# Patient Record
Sex: Female | Born: 1970 | Race: White | Hispanic: No | Marital: Single | Smoking: Never smoker
Health system: Southern US, Community
[De-identification: ages and names within clinical notes are randomized; demographics above are authoritative.]

## PROBLEM LIST (undated history)

## (undated) DIAGNOSIS — F418 Other specified anxiety disorders: Secondary | ICD-10-CM

## (undated) DIAGNOSIS — Z460 Encounter for fitting and adjustment of spectacles and contact lenses: Secondary | ICD-10-CM

## (undated) DIAGNOSIS — Z789 Other specified health status: Secondary | ICD-10-CM

## (undated) DIAGNOSIS — R6889 Other general symptoms and signs: Secondary | ICD-10-CM

## (undated) DIAGNOSIS — F329 Major depressive disorder, single episode, unspecified: Secondary | ICD-10-CM

## (undated) DIAGNOSIS — F32A Depression, unspecified: Secondary | ICD-10-CM

## (undated) DIAGNOSIS — K8020 Calculus of gallbladder without cholecystitis without obstruction: Secondary | ICD-10-CM

## (undated) DIAGNOSIS — R41840 Attention and concentration deficit: Secondary | ICD-10-CM

## (undated) HISTORY — DX: Depression, unspecified: F32.A

## (undated) HISTORY — DX: Calculus of gallbladder without cholecystitis without obstruction: K80.20

---
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
## 1998-09-24 HISTORY — PX: ENDOMETRIAL ABLATION: SHX621

## 2000-11-29 ENCOUNTER — Other Ambulatory Visit: Admission: RE | Admit: 2000-11-29 | Discharge: 2000-11-29 | Payer: Self-pay | Admitting: *Deleted

## 2002-02-10 ENCOUNTER — Other Ambulatory Visit: Admission: RE | Admit: 2002-02-10 | Discharge: 2002-02-10 | Payer: Self-pay | Admitting: Obstetrics and Gynecology

## 2002-02-23 ENCOUNTER — Other Ambulatory Visit: Admission: RE | Admit: 2002-02-23 | Discharge: 2002-02-23 | Payer: Self-pay | Admitting: Obstetrics and Gynecology

## 2002-10-16 ENCOUNTER — Other Ambulatory Visit: Admission: RE | Admit: 2002-10-16 | Discharge: 2002-10-16 | Payer: Self-pay | Admitting: Obstetrics and Gynecology

## 2003-07-01 ENCOUNTER — Other Ambulatory Visit: Admission: RE | Admit: 2003-07-01 | Discharge: 2003-07-01 | Payer: Self-pay | Admitting: Obstetrics and Gynecology

## 2012-03-11 ENCOUNTER — Other Ambulatory Visit: Payer: Self-pay | Admitting: Gastroenterology

## 2012-03-11 DIAGNOSIS — R109 Unspecified abdominal pain: Secondary | ICD-10-CM

## 2012-03-25 ENCOUNTER — Ambulatory Visit (HOSPITAL_COMMUNITY): Admission: RE | Admit: 2012-03-25 | Payer: Self-pay | Source: Ambulatory Visit

## 2012-03-25 ENCOUNTER — Ambulatory Visit (HOSPITAL_COMMUNITY)
Admission: RE | Admit: 2012-03-25 | Discharge: 2012-03-25 | Disposition: A | Discharge status: Home / Self Care | Payer: BC Managed Care – PPO | Source: Ambulatory Visit | Attending: Gastroenterology | Admitting: Gastroenterology

## 2012-03-25 DIAGNOSIS — K8020 Calculus of gallbladder without cholecystitis without obstruction: Secondary | ICD-10-CM | POA: Insufficient documentation

## 2012-03-25 DIAGNOSIS — R109 Unspecified abdominal pain: Secondary | ICD-10-CM

## 2012-04-07 ENCOUNTER — Telehealth (INDEPENDENT_AMBULATORY_CARE_PROVIDER_SITE_OTHER): Payer: Self-pay

## 2012-04-07 NOTE — Telephone Encounter (Signed)
The pt was referred by Dr Loreta Ave for gallstones.  She is having increasing pain and vomited this am.  She feels feverish at times.  She wants to be put on a cancellation list if she can be put in sooner for Dr Lindie Spruce.  Please call.

## 2012-04-29 ENCOUNTER — Ambulatory Visit (INDEPENDENT_AMBULATORY_CARE_PROVIDER_SITE_OTHER): Payer: BC Managed Care – PPO | Admitting: General Surgery

## 2012-04-29 ENCOUNTER — Encounter (INDEPENDENT_AMBULATORY_CARE_PROVIDER_SITE_OTHER): Payer: Self-pay | Admitting: General Surgery

## 2012-04-29 VITALS — BP 114/62 | HR 68 | Temp 97.4°F | Resp 14 | Ht 72.0 in | Wt 212.4 lb

## 2012-04-29 DIAGNOSIS — K8020 Calculus of gallbladder without cholecystitis without obstruction: Secondary | ICD-10-CM

## 2012-04-29 NOTE — Progress Notes (Signed)
Patient ID: Samantha Parker, female   DOB: [birth year], 41 y.o.   MRN: [id number]  Chief Complaint  Patient presents with  . Cholelithiasis    HPI Samantha Parker is a 41 y.o. female.  Symptomatic cholelithiasis.   HPI   Patient has been having intermittent pain and discomfort for the past 4-5 months in the RUQ.  Not related to meals.  Comes and goes, but is constantly at a 4-5/10 level.  No fevers or chills.  No jaundice. Past Medical History  Diagnosis Date  . Gallstones     Past Surgical History  Procedure Date  . Endometrial ablation 2000    Family History  Problem Relation Age of Onset  . Cancer Maternal Grandmother     breast    Social History History  Substance Use Topics  . Smoking status: Passive Smoker    Types: Cigarettes  . Smokeless tobacco: Never Used  . Alcohol Use: No    Allergies  Allergen Reactions  . Penicillins Nausea Only and Rash    Rash will spread all over the body.    No current outpatient prescriptions on file.    Review of Systems Review of Systems  Constitutional: Negative.   HENT: Negative.   Gastrointestinal: Positive for nausea and abdominal pain (RUQ). Negative for diarrhea, anal bleeding and rectal pain.  Genitourinary: Negative.   Musculoskeletal: Negative.   Skin: Negative.   Neurological: Negative.   Hematological: Negative.   Psychiatric/Behavioral: Negative.     Blood pressure 114/62, pulse 68, temperature 97.4 F (36.3 C), temperature source Temporal, resp. rate 14, height 6' (1.829 m), weight 212 lb 6 oz (96.333 kg).  Physical Exam Physical Exam  Constitutional: She is oriented to person, place, and time. She appears well-developed and well-nourished.  HENT:  Head: Normocephalic and atraumatic.  Eyes: Conjunctivae are normal. Pupils are equal, round, and reactive to light.  Neck: Normal range of motion. Neck supple.  Cardiovascular: Normal rate, regular rhythm and normal heart sounds.   Pulmonary/Chest:  Effort normal and breath sounds normal.  Abdominal: Soft. Bowel sounds are normal. There is tenderness in the right upper quadrant. There is no rigidity and negative Murphy's sign.    Musculoskeletal: Normal range of motion.  Neurological: She is alert and oriented to person, place, and time. She has normal reflexes.  Skin: Skin is warm and dry.  Psychiatric: She has a normal mood and affect. Her behavior is normal. Judgment and thought content normal.    Data Reviewed Ultrasound and labs  Assessment    Symptomatic cholelithiasis with chronic cholecystitis    Plan    Laparoscopic cholecystectomy with IOC Risks and benefits have been explained to the patient and she wishes to proceed. Given brochure about surgery.       Cherylynn Ridges 04/29/2012, 12:30 PM

## 2012-05-02 ENCOUNTER — Encounter (HOSPITAL_BASED_OUTPATIENT_CLINIC_OR_DEPARTMENT_OTHER): Payer: Self-pay | Admitting: *Deleted

## 2012-05-02 NOTE — Progress Notes (Signed)
To come in for labs No cardiac or resp problems

## 2012-05-07 ENCOUNTER — Encounter (HOSPITAL_BASED_OUTPATIENT_CLINIC_OR_DEPARTMENT_OTHER)
Admission: RE | Admit: 2012-05-07 | Discharge: 2012-05-07 | Disposition: A | Discharge status: Home / Self Care | Payer: BC Managed Care – PPO | Source: Ambulatory Visit | Attending: General Surgery | Admitting: General Surgery

## 2012-05-07 LAB — COMPREHENSIVE METABOLIC PANEL
CO2: 23 mEq/L (ref 19–32)
Calcium: 9.1 mg/dL (ref 8.4–10.5)
Creatinine, Ser: 0.81 mg/dL (ref 0.50–1.10)
GFR calc Af Amer: >90 mL/min (ref >90)
GFR calc non Af Amer: 88 mL/min — ABNORMAL LOW (ref >90)
Glucose, Bld: 115 mg/dL — ABNORMAL HIGH (ref 70–99)
Total Bilirubin: 0.2 mg/dL — ABNORMAL LOW (ref 0.3–1.2)

## 2012-05-07 LAB — CBC WITH DIFFERENTIAL/PLATELET
Eosinophils Relative: 2 % (ref 0–5)
HCT: 41.9 % (ref 36.0–46.0)
Hemoglobin: 13.6 g/dL (ref 12.0–15.0)
Lymphocytes Relative: 17 % (ref 12–46)
Lymphs Abs: 1.9 10*3/uL (ref 0.7–4.0)
MCV: 90.5 fL (ref 78.0–100.0)
Monocytes Absolute: 0.6 10*3/uL (ref 0.1–1.0)
Monocytes Relative: 5 % (ref 3–12)
RBC: 4.63 MIL/uL (ref 3.87–5.11)
WBC: 10.9 10*3/uL — ABNORMAL HIGH (ref 4.0–10.5)

## 2012-05-08 ENCOUNTER — Ambulatory Visit (HOSPITAL_BASED_OUTPATIENT_CLINIC_OR_DEPARTMENT_OTHER): Payer: BC Managed Care – PPO | Admitting: Anesthesiology

## 2012-05-08 ENCOUNTER — Encounter (HOSPITAL_BASED_OUTPATIENT_CLINIC_OR_DEPARTMENT_OTHER): Payer: Self-pay | Admitting: Anesthesiology

## 2012-05-08 ENCOUNTER — Ambulatory Visit (HOSPITAL_BASED_OUTPATIENT_CLINIC_OR_DEPARTMENT_OTHER)
Admission: RE | Admit: 2012-05-08 | Discharge: 2012-05-08 | Disposition: A | Discharge status: Home / Self Care | Payer: BC Managed Care – PPO | Source: Ambulatory Visit | Attending: General Surgery | Admitting: General Surgery

## 2012-05-08 ENCOUNTER — Encounter (HOSPITAL_BASED_OUTPATIENT_CLINIC_OR_DEPARTMENT_OTHER)
Admission: RE | Disposition: A | Discharge status: Home / Self Care | Payer: Self-pay | Source: Ambulatory Visit | Attending: General Surgery

## 2012-05-08 ENCOUNTER — Encounter (HOSPITAL_BASED_OUTPATIENT_CLINIC_OR_DEPARTMENT_OTHER): Payer: Self-pay

## 2012-05-08 ENCOUNTER — Ambulatory Visit (HOSPITAL_COMMUNITY): Payer: BC Managed Care – PPO

## 2012-05-08 DIAGNOSIS — K8010 Calculus of gallbladder with chronic cholecystitis without obstruction: Secondary | ICD-10-CM

## 2012-05-08 DIAGNOSIS — K8020 Calculus of gallbladder without cholecystitis without obstruction: Secondary | ICD-10-CM

## 2012-05-08 HISTORY — DX: Other specified health status: Z78.9

## 2012-05-08 HISTORY — PX: CHOLECYSTECTOMY: SHX55

## 2012-05-08 HISTORY — DX: Encounter for fitting and adjustment of spectacles and contact lenses: Z46.0

## 2012-05-08 SURGERY — LAPAROSCOPIC CHOLECYSTECTOMY WITH INTRAOPERATIVE CHOLANGIOGRAM
Anesthesia: General | Site: Abdomen | Wound class: Clean

## 2012-05-08 MED ORDER — LACTATED RINGERS IV SOLN
INTRAVENOUS | Status: DC
  Administered 2012-05-08 (×2): via INTRAVENOUS

## 2012-05-08 MED ORDER — FENTANYL CITRATE 0.05 MG/ML IJ SOLN
INTRAMUSCULAR | Status: DC | PRN
  Administered 2012-05-08: 25 ug via INTRAVENOUS
  Administered 2012-05-08: 50 ug via INTRAVENOUS
  Administered 2012-05-08: 100 ug via INTRAVENOUS
  Administered 2012-05-08: 25 ug via INTRAVENOUS

## 2012-05-08 MED ORDER — MIDAZOLAM HCL 5 MG/5ML IJ SOLN
INTRAMUSCULAR | Status: DC | PRN
  Administered 2012-05-08: 2 mg via INTRAVENOUS

## 2012-05-08 MED ORDER — ONDANSETRON HCL 4 MG/2ML IJ SOLN: 4.0000 mg | Freq: Once | INTRAMUSCULAR | Status: DC | PRN

## 2012-05-08 MED ORDER — METOCLOPRAMIDE HCL 5 MG/ML IJ SOLN
INTRAMUSCULAR | Status: DC | PRN
  Administered 2012-05-08: 10 mg via INTRAVENOUS

## 2012-05-08 MED ORDER — NEOSTIGMINE METHYLSULFATE 1 MG/ML IJ SOLN
INTRAMUSCULAR | Status: DC | PRN
  Administered 2012-05-08: 3 mg via INTRAVENOUS

## 2012-05-08 MED ORDER — ONDANSETRON HCL 4 MG/2ML IJ SOLN
INTRAMUSCULAR | Status: DC | PRN
  Administered 2012-05-08: 4 mg via INTRAVENOUS

## 2012-05-08 MED ORDER — SODIUM CHLORIDE 0.9 % IJ SOLN
INTRAMUSCULAR | Status: DC | PRN
  Administered 2012-05-08 (×2)

## 2012-05-08 MED ORDER — PROPOFOL 10 MG/ML IV EMUL
INTRAVENOUS | Status: DC | PRN
  Administered 2012-05-08: 200 mg via INTRAVENOUS
  Administered 2012-05-08 (×2): 40 mg via INTRAVENOUS

## 2012-05-08 MED ORDER — CIPROFLOXACIN IN D5W 400 MG/200ML IV SOLN
400.0000 mg | INTRAVENOUS | Status: AC
  Administered 2012-05-08: 400 mg via INTRAVENOUS

## 2012-05-08 MED ORDER — BUPIVACAINE-EPINEPHRINE PF 0.25-1:200000 % IJ SOLN
INTRAMUSCULAR | Status: DC | PRN
  Administered 2012-05-08: 17 mL

## 2012-05-08 MED ORDER — HYDROMORPHONE HCL PF 1 MG/ML IJ SOLN
0.2500 mg | INTRAMUSCULAR | Status: DC | PRN
  Administered 2012-05-08 (×3): 0.5 mg via INTRAVENOUS

## 2012-05-08 MED ORDER — DEXAMETHASONE SODIUM PHOSPHATE 4 MG/ML IJ SOLN
INTRAMUSCULAR | Status: DC | PRN
  Administered 2012-05-08: 10 mg via INTRAVENOUS

## 2012-05-08 MED ORDER — LIDOCAINE HCL (CARDIAC) 10 MG/ML IV SOLN
INTRAVENOUS | Status: DC | PRN
  Administered 2012-05-08: 100 mg via INTRAVENOUS

## 2012-05-08 MED ORDER — SODIUM CHLORIDE 0.9 % IV SOLN
INTRAVENOUS | Status: DC | PRN
  Administered 2012-05-08: 450 mL via INTRAMUSCULAR

## 2012-05-08 MED ORDER — HYDROCODONE-ACETAMINOPHEN 5-500 MG PO TABS: 1.0000 | ORAL_TABLET | Freq: Four times a day (QID) | ORAL | Status: AC | PRN

## 2012-05-08 MED ORDER — ROCURONIUM BROMIDE 100 MG/10ML IV SOLN
INTRAVENOUS | Status: DC | PRN
  Administered 2012-05-08: 50 mg via INTRAVENOUS

## 2012-05-08 MED ORDER — GLYCOPYRROLATE 0.2 MG/ML IJ SOLN
INTRAMUSCULAR | Status: DC | PRN
  Administered 2012-05-08: 0.4 mg via INTRAVENOUS

## 2012-05-08 SURGICAL SUPPLY — 52 items
ADH SKN CLS APL DERMABOND .7 (GAUZE/BANDAGES/DRESSINGS) ×1
APPLIER CLIP 5 13 M/L LIGAMAX5 (MISCELLANEOUS) ×4
APPLIER CLIP ROT 10 11.4 M/L (STAPLE) ×2
APR CLP MED LRG 11.4X10 (STAPLE) ×1
APR CLP MED LRG 5 ANG JAW (MISCELLANEOUS) ×2
BAG SPEC RTRVL LRG 6X4 10 (ENDOMECHANICALS) ×1
BLADE SURG ROTATE 9660 (MISCELLANEOUS) IMPLANT
CANISTER SUCTION 2500CC (MISCELLANEOUS) ×2 IMPLANT
CHLORAPREP W/TINT 26ML (MISCELLANEOUS) ×2 IMPLANT
CLEANER CAUTERY TIP 5X5 PAD (MISCELLANEOUS) ×1 IMPLANT
CLIP APPLIE 5 13 M/L LIGAMAX5 (MISCELLANEOUS) ×1 IMPLANT
CLIP APPLIE ROT 10 11.4 M/L (STAPLE) ×1 IMPLANT
CLOTH BEACON ORANGE TIMEOUT ST (SAFETY) ×2 IMPLANT
COVER MAYO STAND STRL (DRAPES) ×2 IMPLANT
DECANTER SPIKE VIAL GLASS SM (MISCELLANEOUS) IMPLANT
DERMABOND ADVANCED (GAUZE/BANDAGES/DRESSINGS) ×1
DERMABOND ADVANCED .7 DNX12 (GAUZE/BANDAGES/DRESSINGS) ×1 IMPLANT
DRAPE C-ARM 42X72 X-RAY (DRAPES) ×2 IMPLANT
DRAPE OEC MINIVIEW 54X84 (DRAPES) IMPLANT
DRAPE UTILITY XL STRL (DRAPES) ×2 IMPLANT
DRSG TEGADERM 2-3/8X2-3/4 SM (GAUZE/BANDAGES/DRESSINGS) ×4 IMPLANT
ELECT REM PT RETURN 9FT ADLT (ELECTROSURGICAL) ×2
ELECTRODE REM PT RTRN 9FT ADLT (ELECTROSURGICAL) ×1 IMPLANT
GLOVE BIOGEL M STRL SZ7.5 (GLOVE) ×1 IMPLANT
GLOVE BIOGEL PI IND STRL 8 (GLOVE) ×1 IMPLANT
GLOVE BIOGEL PI INDICATOR 8 (GLOVE) ×3
GLOVE ECLIPSE 7.5 STRL STRAW (GLOVE) ×2 IMPLANT
GOWN STRL NON-REIN LRG LVL3 (GOWN DISPOSABLE) ×6 IMPLANT
HEMOSTAT SNOW SURGICEL 2X4 (HEMOSTASIS) IMPLANT
NDL HYPO 25X1 1.5 SAFETY (NEEDLE) IMPLANT
NDL SAFETY ECLIPSE 18X1.5 (NEEDLE) IMPLANT
NEEDLE HYPO 18GX1.5 SHARP (NEEDLE) ×2
NEEDLE HYPO 25X1 1.5 SAFETY (NEEDLE) ×2 IMPLANT
NS IRRIG 1000ML POUR BTL (IV SOLUTION) ×1 IMPLANT
PACK BASIN DAY SURGERY FS (CUSTOM PROCEDURE TRAY) ×2 IMPLANT
PAD CLEANER CAUTERY TIP 5X5 (MISCELLANEOUS) ×1
POUCH SPECIMEN RETRIEVAL 10MM (ENDOMECHANICALS) ×2 IMPLANT
SCISSORS LAP 5X35 DISP (ENDOMECHANICALS) IMPLANT
SET CHOLANGIOGRAPH 5 50 .035 (SET/KITS/TRAYS/PACK) ×1 IMPLANT
SET IRRIG TUBING LAPAROSCOPIC (IRRIGATION / IRRIGATOR) ×2 IMPLANT
SLEEVE ENDOPATH XCEL 5M (ENDOMECHANICALS) ×5 IMPLANT
SLEEVE SCD COMPRESS KNEE MED (MISCELLANEOUS) ×2 IMPLANT
SPECIMEN JAR SMALL (MISCELLANEOUS) ×2 IMPLANT
SUT MNCRL AB 4-0 PS2 18 (SUTURE) ×2 IMPLANT
SYR 20CC LL (SYRINGE) ×1 IMPLANT
TOWEL OR 17X24 6PK STRL BLUE (TOWEL DISPOSABLE) ×2 IMPLANT
TOWEL OR NON WOVEN STRL DISP B (DISPOSABLE) ×2 IMPLANT
TRAY LAPAROSCOPIC (CUSTOM PROCEDURE TRAY) ×2 IMPLANT
TROCAR XCEL BLUNT TIP 100MML (ENDOMECHANICALS) ×2 IMPLANT
TROCAR XCEL NON-BLD 11X100MML (ENDOMECHANICALS) IMPLANT
TROCAR XCEL NON-BLD 5MMX100MML (ENDOMECHANICALS) ×2 IMPLANT
TUBE CONNECTING 20X1/4 (TUBING) ×2 IMPLANT

## 2012-05-08 NOTE — Anesthesia Preprocedure Evaluation (Signed)
Anesthesia Evaluation  Patient identified by MRN, date of birth, ID band Patient awake    Reviewed: Allergy & Precautions, H&P , NPO status , Patient's Chart, lab work & pertinent test results  Airway Mallampati: I TM Distance: >3 FB Neck ROM: Full    Dental  (+) Dental Advisory Given   Pulmonary  breath sounds clear to auscultation        Cardiovascular Rhythm:Regular Rate:Normal     Neuro/Psych    GI/Hepatic   Endo/Other    Renal/GU      Musculoskeletal   Abdominal   Peds  Hematology   Anesthesia Other Findings   Reproductive/Obstetrics                          Anesthesia Physical Anesthesia Plan  ASA: I  Anesthesia Plan: General   Post-op Pain Management:    Induction: Intravenous  Airway Management Planned: Oral ETT  Additional Equipment:   Intra-op Plan:   Post-operative Plan: Extubation in OR  Informed Consent: I have reviewed the patients History and Physical, chart, labs and discussed the procedure including the risks, benefits and alternatives for the proposed anesthesia with the patient or authorized representative who has indicated his/her understanding and acceptance.   Dental advisory given  Plan Discussed with: CRNA, Anesthesiologist and Surgeon  Anesthesia Plan Comments:         Anesthesia Quick Evaluation  

## 2012-05-08 NOTE — Anesthesia Postprocedure Evaluation (Signed)
Anesthesia Post Note  [name]  Procedure(s) Performed: Procedure(s) (LRB): LAPAROSCOPIC CHOLECYSTECTOMY WITH INTRAOPERATIVE CHOLANGIOGRAM (N/A)  Anesthesia type: General  Patient location: PACU  Post pain: Pain level controlled  Post assessment: Patient's Cardiovascular Status Stable  Last Vitals:  Filed Vitals:   05/08/12 1530  BP: 117/66  Pulse: 92  Temp:   Resp: 15    Post vital signs: Reviewed and stable  Level of consciousness: alert  Complications: No apparent anesthesia complications

## 2012-05-08 NOTE — Op Note (Signed)
OPERATIVE REPORT  DATE OF OPERATION: 05/08/2012  PATIENT:  Samantha Parker  41 y.o. female  PRE-OPERATIVE DIAGNOSIS:  symptomatic gallstones  POST-OPERATIVE DIAGNOSIS:  symptomatic gallstones/hydrops of gallbladder  PROCEDURE:  Procedure(s): LAPAROSCOPIC CHOLECYSTECTOMY WITH INTRAOPERATIVE CHOLANGIOGRAM  SURGEON:  Surgeon(s): Cherylynn Ridges, MD  ASSISTANT: None  ANESTHESIA:   general  EBL: <20 ml  BLOOD ADMINISTERED: none  DRAINS: none   SPECIMEN:  Source of Specimen:  Gallbladder with stones  COUNTS CORRECT:  YES  PROCEDURE DETAILS: The patient was taken to the operating room and placed on the table in the supine position.  After an adequate endotracheal anesthetic was administered, (she/he) was prepped with ChloroPrep, and then draped in the usual manner exposing the entire abdomen laterally, inferiorly and up  to the costal margins.  After a proper timeout was performed including identifying the patient and the procedure to be performed, a supra-umbilical1.5cm midline incision was made using a #15 blade.  This was taken down to the fascia which was then incised with a #15 blade.  The edges of the fascia were tented up with Kocher clamps as the preperitoneal space was penetrated with a Kelly clamp into the peritoneum.  Once this was done, a pursestring suture of 0 Vicryl was passed around the fascial opening.  This was subsequently used to secure the [facility] cannula which was passed into the peritoneal cavity.  Once the [facility] cannula was in place, carbon dioxide gas was insufflated into the peritoneal cavity up to a maximal intra-abdominal pressure of 15mm Hg.The laparoscope, with attached camera and light source, was passed into the peritoneal cavity to visualize the direct insertion of two right upper quadrant 5mm cannulas, and a sup-xiphoid 5mm cannula.  Once all cannulas were in place, the dissection was begun.  The gallbladder was noted to be tense and distended.It was  aspirated with a long laparoscopic need attached to a syringe yeilding clear bile--hydrops of the gallbladder.  The cystic duct--after further dissection--appeared to be completely obstructed by multiple large stones.  Two ratcheted graspers were attached to the dome and infundibulum of the gallbladder after decompression and retracted towards the anterior abdominal wall and the right upper quadrant.  Using cautery attached to a dissecting forceps, the peritoneum overlaying the triangle of Chalot and the hepatoduodenal triangle was dissected away exposing the cystic duct and the cystic artery.  A clip was placed on the gallbladder side of the cystic duct, then a cholecytodochotomy made using the laparoscopic scissors.  Through the cholecystodochotomy a Cook catheter was passed to performed a cholangiogram.  The cholangiogram showed a dilated CBD without obvious intra-ductal stones, good flow into the duodenum.  The was fair proximal filling without obstruction.  Once the cholangiogram was completed, the [facility] catheter was removed, and the distal cystic duct was clipped multiple times then transected.  The gallbladder was then dissected out of the hepatic bed without event.  It was retrieved from the abdomen (using an EndoCatch bag) having to enlarge the fascial incision slightly..  Once the gallbladder was removed, the bed was inspected for hemostasis.  Once excellent hemostasis was obtained all gas and fluids were aspirated from above the liver, then the cannulas were removed.  The supra-umbilical incision was closed using the pursestring suture which was in place.  0.25% bupivicaine with epinephrine was injected at all sites.  All 10mm or greater cannula sites were close using a running subcuticular stitch of 4-0 Monocryl.  5.64mm cannula sites were closed with Dermabond only.Steri-Strips and  Tagaderm were used to complete the dressings at all sites.  At this point all needle, sponge, and instrument counts  were correct.The patient was awakened from anesthesia and taken to the PACU in stable condition.  PATIENT DISPOSITION:  PACU - hemodynamically stable.   Cherylynn Ridges 8/15/20132:31 PM

## 2012-05-08 NOTE — Anesthesia Procedure Notes (Signed)
Procedure Name: Intubation Date/Time: 05/08/2012 1:17 PM Performed by: Gar Gibbon Pre-anesthesia Checklist: Patient identified, Emergency Drugs available, Suction available and Patient being monitored Patient Re-evaluated:Patient Re-evaluated prior to inductionOxygen Delivery Method: Circle System Utilized Preoxygenation: Pre-oxygenation with 100% oxygen Intubation Type: IV induction Ventilation: Mask ventilation without difficulty Laryngoscope Size: Mac and 3 Grade View: Grade III Tube type: Oral Tube size: 7.0 mm Number of attempts: 1 Airway Equipment and Method: stylet and oral airway Placement Confirmation: ETT inserted through vocal cords under direct vision,  positive ETCO2 and breath sounds checked- equal and bilateral Secured at: 21 cm Tube secured with: Tape Dental Injury: Teeth and Oropharynx as per pre-operative assessment

## 2012-05-08 NOTE — H&P (Signed)
  Patient ID: Samantha Parker, female DOB: 1971/04/27, 41 y.o. MRN: [id number]  Chief Complaint   Patient presents with   .  Cholelithiasis   HPI  Samantha Parker is a 41 y.o. female. Symptomatic cholelithiasis.  HPI  Patient has been having intermittent pain and discomfort for the past 4-5 months in the RUQ. Not related to meals. Comes and goes, but is constantly at a 4-5/10 level. No fevers or chills. No jaundice.  Past Medical History   Diagnosis  Date   .  Gallstones     Past Surgical History   Procedure  Date   .  Endometrial ablation  2000    Family History   Problem  Relation  Age of Onset   .  Cancer  Maternal Grandmother       breast   Social History  History   Substance Use Topics   .  Smoking status:  Passive Smoker     Types:  Cigarettes   .  Smokeless tobacco:  Never Used   .  Alcohol Use:  No    Allergies   Allergen  Reactions   .  Penicillins  Nausea Only and Rash     Rash will spread all over the body.    No current outpatient prescriptions on file.   Review of Systems  Review of Systems  Constitutional: Negative.  HENT: Negative.  Gastrointestinal: Positive for nausea and abdominal pain (RUQ). Negative for diarrhea, anal bleeding and rectal pain.  Genitourinary: Negative.  Musculoskeletal: Negative.  Skin: Negative.  Neurological: Negative.  Hematological: Negative.  Psychiatric/Behavioral: Negative.  Blood pressure 114/62, pulse 68, temperature 97.4 F (36.3 C), temperature source Temporal, resp. rate 14, height 6' (1.829 m), weight 212 lb 6 oz (96.333 kg).  Physical Exam  Physical Exam  Constitutional: She is oriented to person, place, and time. She appears well-developed and well-nourished.  HENT:  Head: Normocephalic and atraumatic.  Eyes: Conjunctivae are normal. Pupils are equal, round, and reactive to light.  Neck: Normal range of motion. Neck supple.  Cardiovascular: Normal rate, regular rhythm and normal heart sounds.  Pulmonary/Chest:  Effort normal and breath sounds normal.  Abdominal: Soft. Bowel sounds are normal. There is tenderness in the right upper quadrant. There is no rigidity and negative Murphy's sign.    Musculoskeletal: Normal range of motion.  Neurological: She is alert and oriented to person, place, and time. She has normal reflexes.  Skin: Skin is warm and dry.  Psychiatric: She has a normal mood and affect. Her behavior is normal. Judgment and thought content normal.  Data Reviewed  Ultrasound and labs  Assessment  Symptomatic cholelithiasis with chronic cholecystitis  Plan  Laparoscopic cholecystectomy with IOC  Risks and benefits have been explained to the patient and she wishes to proceed.  Given brochure about surgery.

## 2012-05-08 NOTE — Transfer of Care (Signed)
Immediate Anesthesia Transfer of Care Note  [name]  Procedure(s) Performed: Procedure(s) (LRB): LAPAROSCOPIC CHOLECYSTECTOMY WITH INTRAOPERATIVE CHOLANGIOGRAM (N/A)  Patient Location: PACU  Anesthesia Type: General  Level of Consciousness: awake and patient cooperative  Airway & Oxygen Therapy: Patient Spontanous Breathing and Patient connected to face mask oxygen  Post-op Assessment: Report given to PACU RN and Post -op Vital signs reviewed and stable  Post vital signs: Reviewed and stable  Complications: No apparent anesthesia complications

## 2012-05-12 ENCOUNTER — Encounter (HOSPITAL_BASED_OUTPATIENT_CLINIC_OR_DEPARTMENT_OTHER): Payer: Self-pay | Admitting: General Surgery

## 2012-05-27 ENCOUNTER — Ambulatory Visit (INDEPENDENT_AMBULATORY_CARE_PROVIDER_SITE_OTHER): Payer: BC Managed Care – PPO | Admitting: General Surgery

## 2012-05-27 ENCOUNTER — Encounter (INDEPENDENT_AMBULATORY_CARE_PROVIDER_SITE_OTHER): Payer: Self-pay | Admitting: General Surgery

## 2012-05-27 VITALS — BP 126/80 | HR 70 | Temp 96.4°F | Resp 18 | Ht 71.0 in | Wt 211.0 lb

## 2012-05-27 DIAGNOSIS — Z09 Encounter for follow-up examination after completed treatment for conditions other than malignant neoplasm: Secondary | ICD-10-CM | POA: Insufficient documentation

## 2012-05-27 NOTE — Progress Notes (Signed)
The patient is doing well status post laparoscopic cholecystectomy. The periumbilical incision separated somewhat and is healing by secondary intention. All her other incisions are healing well with no evidence of infection.  Symptomatically the patient is doing well since the day after surgery. She continues to do well eating well with no evidence of any postoperative problems.  She is to return to see me on a when necessary basis.

## 2012-09-30 ENCOUNTER — Encounter (INDEPENDENT_AMBULATORY_CARE_PROVIDER_SITE_OTHER): Payer: Self-pay

## 2013-07-08 LAB — HM PAP SMEAR: CHL HPV: NEGATIVE

## 2013-08-24 ENCOUNTER — Other Ambulatory Visit: Payer: Self-pay | Admitting: Dermatology

## 2014-06-10 IMAGING — US US ABDOMEN COMPLETE
1 series · 13 of 25 positions shown · non-contrast
Comparison: None.

CLINICAL DATA: Abdominal pain.

COMPLETE ABDOMINAL ULTRASOUND

[Series 1: us abdomen complete · 0.31mm/px · 13 of 81 slices shown]
[im 1/81]
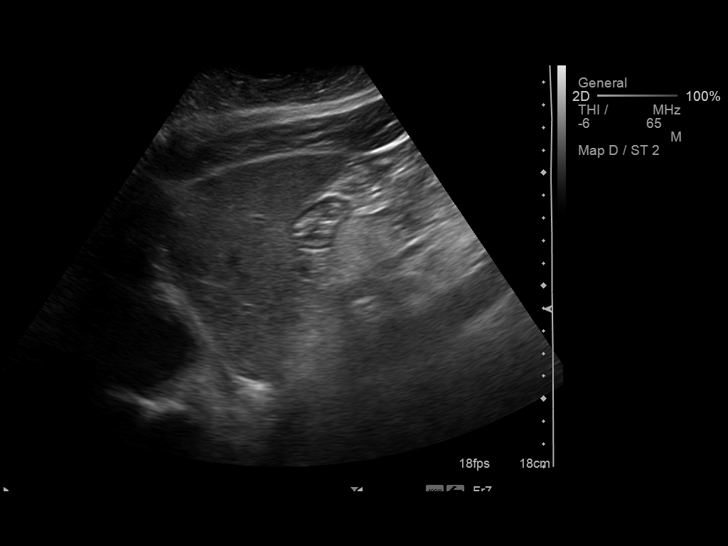
[im 7/81]
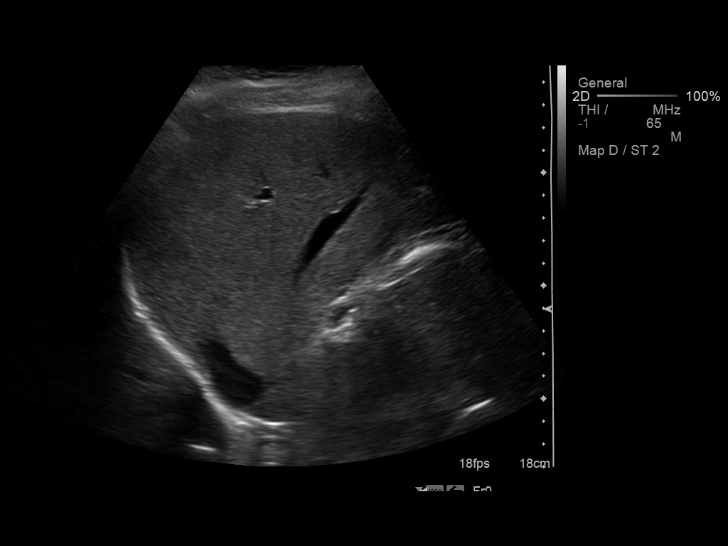
[im 14/81]
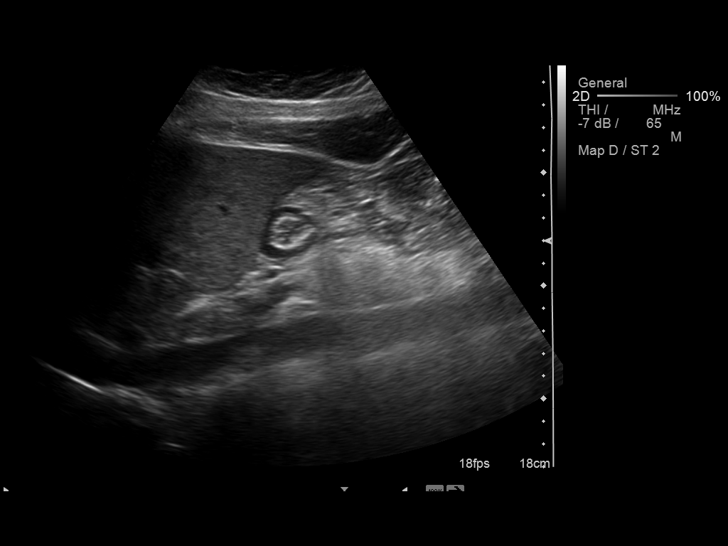
[im 21/81]
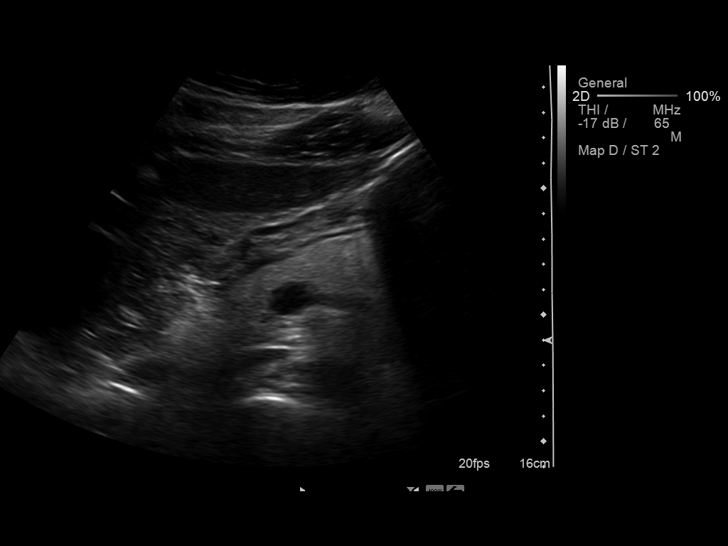
[im 27/81]
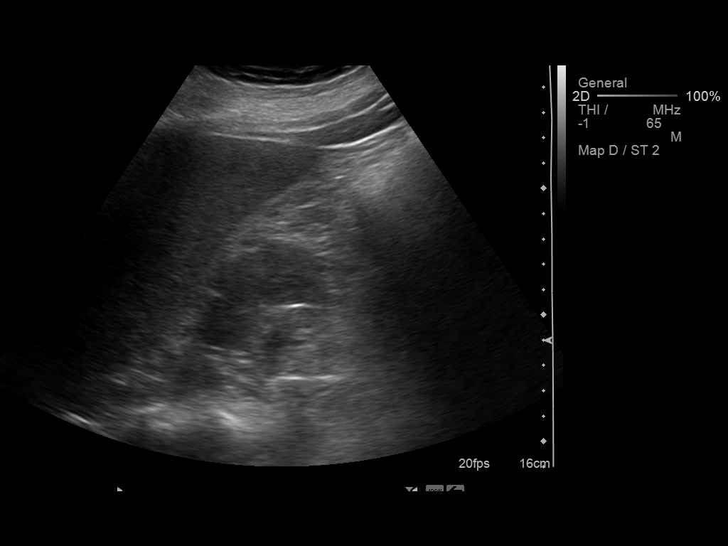
[im 34/81]
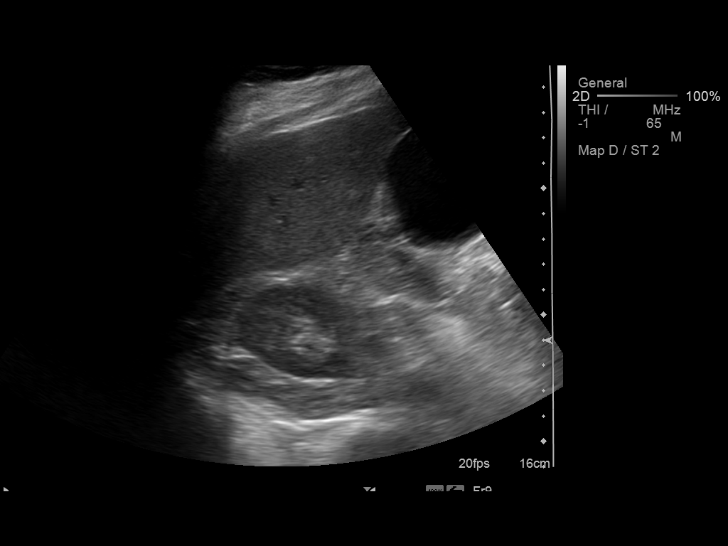
[im 41/81]
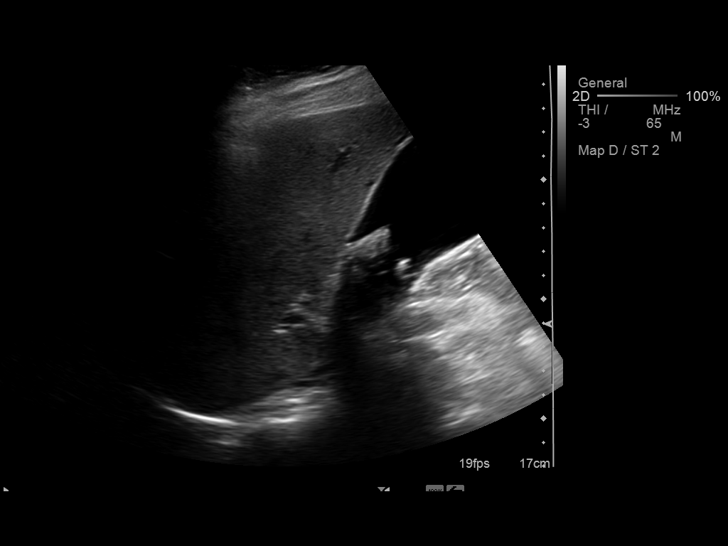
[im 47/81]
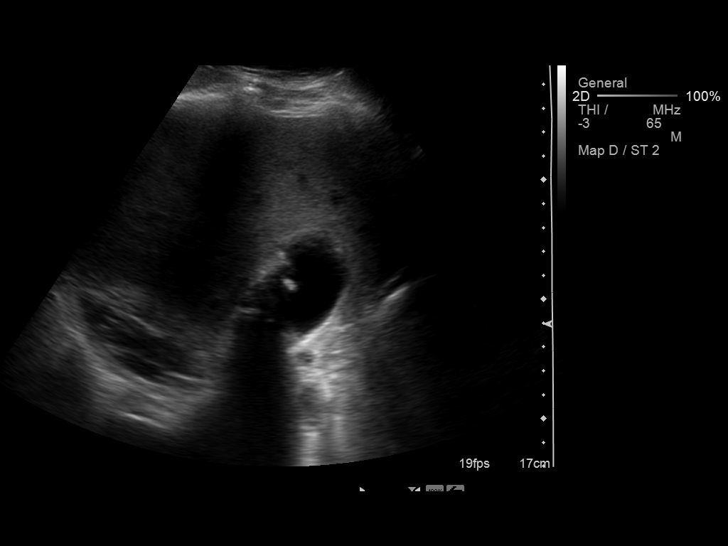
[im 54/81]
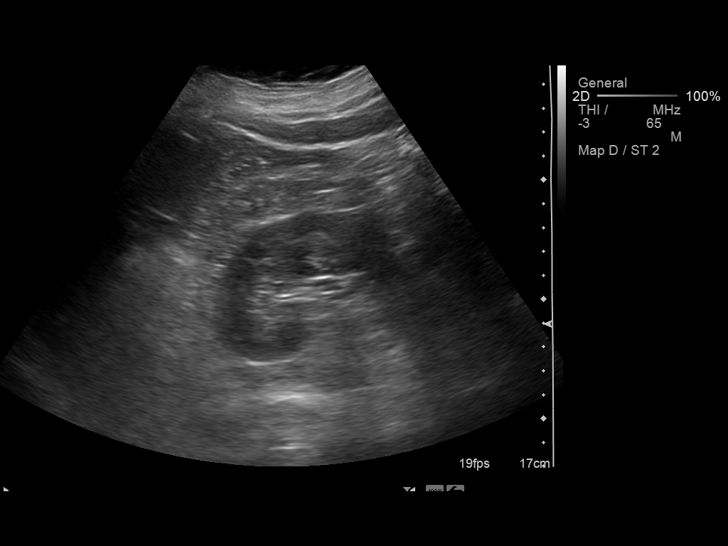
[im 61/81]
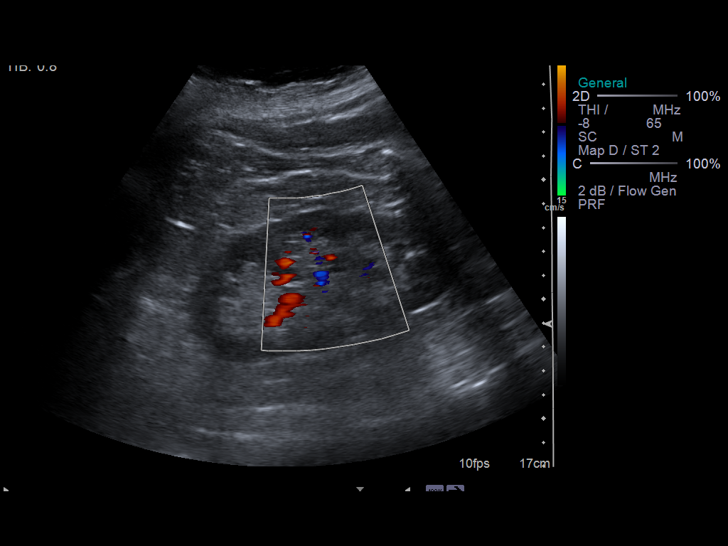
[im 67/81]
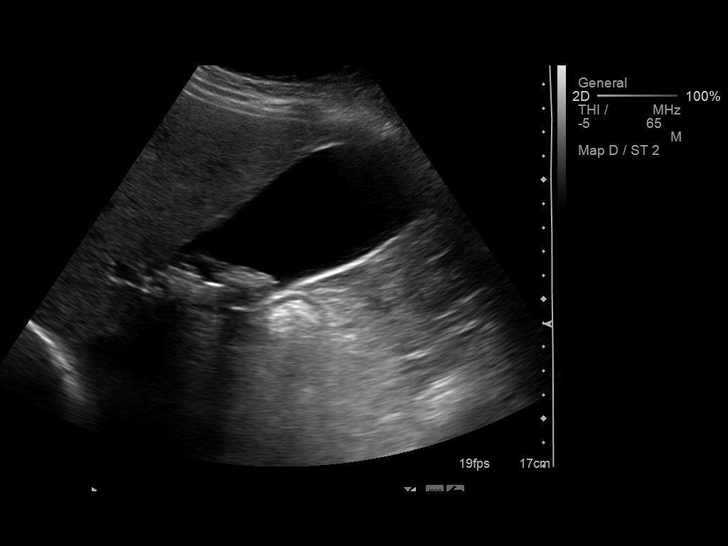
[im 74/81]
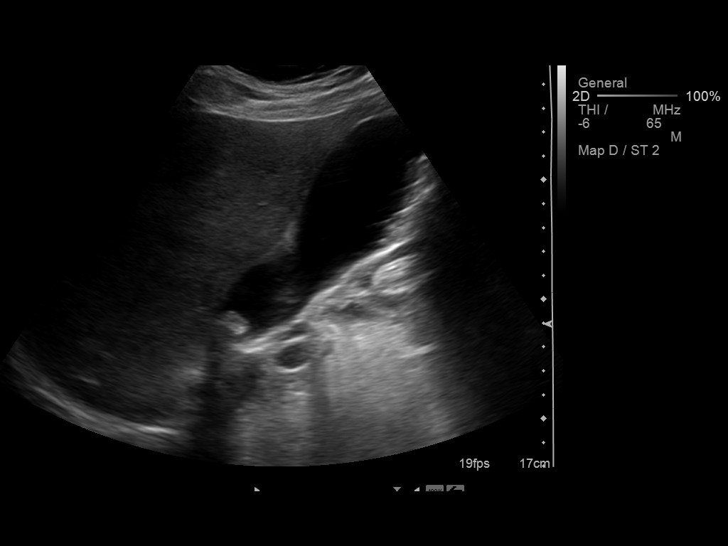
[im 81/81]
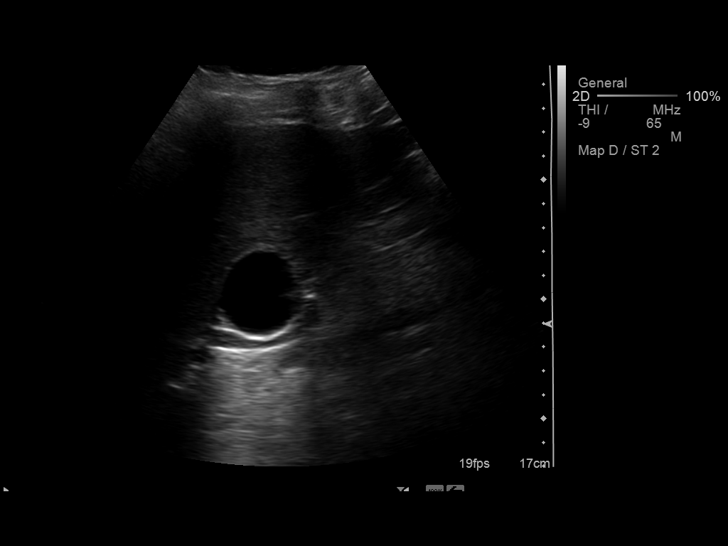

[13 of 25 positions shown; findings below may reference images not displayed]

FINDINGS: Gallbladder:  There are multiple echogenic gallstones in the
gallbladder with associated acoustic shadowing.  The largest
calculus measures 1.6 cm.  No gallbladder wall thickening or
pericholecystic fluid to suggest acute cholecystitis.  Negative
sonographic Murphy's sign.

Common bile duct:  Borderline common bile duct dilatation measuring
6.5 mm.  No definite common bile duct stones are seen.  Recommend
correlation with liver function studies.  ERCP or MRCP may be
helpful for further evaluation if LFT's are abnormal.

Liver:  The liver is sonographically unremarkable.  There is normal
echogenicity without focal lesions or intrahepatic biliary
dilatation.

IVC:  Normal caliber.

Pancreas:  Sonographically normal.

Spleen:  Normal size and echogenicity without focal lesions.

Right Kidney:  11.5 cm in length. Normal renal cortical thickness
and echogenicity without focal lesions or hydronephrosis.

Left Kidney:  11.6 cm in length. Normal renal cortical thickness
and echogenicity without focal lesions or hydronephrosis.

Abdominal aorta:  Normal caliber.
IMPRESSION: 1.  Cholelithiasis without sonographic findings for acute
cholecystitis.
2.  Mild common bile duct dilatation at 6.5 mm.  No definite common
bile duct stones.  ERCP or MRCP may be helpful for further
evaluation if liver function studies are elevated.
3.  No other significant findings.

## 2016-11-07 DIAGNOSIS — H16011 Central corneal ulcer, right eye: Secondary | ICD-10-CM | POA: Diagnosis not present

## 2016-11-08 DIAGNOSIS — H16011 Central corneal ulcer, right eye: Secondary | ICD-10-CM | POA: Diagnosis not present

## 2016-11-12 DIAGNOSIS — H16011 Central corneal ulcer, right eye: Secondary | ICD-10-CM | POA: Diagnosis not present

## 2017-03-14 DIAGNOSIS — L720 Epidermal cyst: Secondary | ICD-10-CM | POA: Diagnosis not present

## 2017-03-14 DIAGNOSIS — L718 Other rosacea: Secondary | ICD-10-CM | POA: Diagnosis not present

## 2017-03-14 DIAGNOSIS — L738 Other specified follicular disorders: Secondary | ICD-10-CM | POA: Diagnosis not present

## 2017-03-14 DIAGNOSIS — L821 Other seborrheic keratosis: Secondary | ICD-10-CM | POA: Diagnosis not present

## 2017-04-09 DIAGNOSIS — R1084 Generalized abdominal pain: Secondary | ICD-10-CM | POA: Diagnosis not present

## 2017-04-12 DIAGNOSIS — R1032 Left lower quadrant pain: Secondary | ICD-10-CM | POA: Diagnosis not present

## 2017-04-12 DIAGNOSIS — N946 Dysmenorrhea, unspecified: Secondary | ICD-10-CM | POA: Diagnosis not present

## 2017-04-15 ENCOUNTER — Ambulatory Visit
Admission: RE | Admit: 2017-04-15 | Discharge: 2017-04-15 | Disposition: A | Discharge status: Home / Self Care | Payer: BLUE CROSS/BLUE SHIELD | Source: Ambulatory Visit | Attending: Physician Assistant | Admitting: Physician Assistant

## 2017-04-15 ENCOUNTER — Encounter: Payer: Self-pay | Admitting: Physician Assistant

## 2017-04-15 ENCOUNTER — Ambulatory Visit (INDEPENDENT_AMBULATORY_CARE_PROVIDER_SITE_OTHER): Payer: BLUE CROSS/BLUE SHIELD | Admitting: Physician Assistant

## 2017-04-15 VITALS — BP 111/75 | HR 70 | Temp 98.1°F | Resp 18 | Ht 71.5 in | Wt 217.6 lb

## 2017-04-15 DIAGNOSIS — R1032 Left lower quadrant pain: Secondary | ICD-10-CM | POA: Diagnosis not present

## 2017-04-15 DIAGNOSIS — N83201 Unspecified ovarian cyst, right side: Secondary | ICD-10-CM | POA: Diagnosis not present

## 2017-04-15 DIAGNOSIS — K439 Ventral hernia without obstruction or gangrene: Secondary | ICD-10-CM | POA: Diagnosis not present

## 2017-04-15 LAB — POCT CBC
GRANULOCYTE PERCENT: 74.2 % (ref 37–80)
HCT, POC: 39.6 % (ref 37.7–47.9)
HEMOGLOBIN: 13.4 g/dL (ref 12.2–16.2)
Lymph, poc: 1.9 (ref 0.6–3.4)
MCH: 28.8 pg (ref 27–31.2)
MCHC: 33.9 g/dL (ref 31.8–35.4)
MCV: 84.9 fL (ref 80–97)
MID (cbc): 0.4 (ref 0–0.9)
MPV: 7.3 fL (ref 0–99.8)
PLATELET COUNT, POC: 360 10*3/uL (ref 142–424)
POC Granulocyte: 6.7 (ref 2–6.9)
POC LYMPH PERCENT: 21.6 %L (ref 10–50)
POC MID %: 4.2 %M (ref 0–12)
RBC: 4.67 M/uL (ref 4.04–5.48)
RDW, POC: 13.9 %
WBC: 9 10*3/uL (ref 4.6–10.2)

## 2017-04-15 LAB — POCT URINALYSIS DIP (MANUAL ENTRY)
BILIRUBIN UA: NEGATIVE mg/dL
Bilirubin, UA: NEGATIVE
Blood, UA: NEGATIVE
GLUCOSE UA: NEGATIVE mg/dL
Leukocytes, UA: NEGATIVE
Nitrite, UA: NEGATIVE
Protein Ur, POC: NEGATIVE mg/dL
SPEC GRAV UA: 1.015 (ref 1.010–1.025)
Urobilinogen, UA: 0.2 E.U./dL
pH, UA: 5.5 (ref 5.0–8.0)

## 2017-04-15 MED ORDER — IOPAMIDOL (ISOVUE-300) INJECTION 61%: 100.0000 mL | Freq: Once | INTRAVENOUS | Status: DC | PRN

## 2017-04-15 MED ORDER — METRONIDAZOLE 500 MG PO TABS: 500.0000 mg | ORAL_TABLET | Freq: Three times a day (TID) | ORAL | 0 refills | Status: DC

## 2017-04-15 MED ORDER — CIPROFLOXACIN HCL 500 MG PO TABS: 500.0000 mg | ORAL_TABLET | Freq: Two times a day (BID) | ORAL | 0 refills | Status: DC

## 2017-04-15 MED ORDER — NAPROXEN 500 MG PO TABS: 500.0000 mg | ORAL_TABLET | Freq: Two times a day (BID) | ORAL | 0 refills | Status: DC

## 2017-04-15 MED ORDER — IOPAMIDOL (ISOVUE-300) INJECTION 61%
125.0000 mL | Freq: Once | INTRAVENOUS | Status: AC | PRN
  Administered 2017-04-15: 125 mL via INTRAVENOUS

## 2017-04-15 NOTE — Progress Notes (Signed)
Samantha Dollyndrea B Cloward  MRN: [id number] DOB: 03/14/1971  Subjective:  Samantha Parker is a 46 y.o. female with endometriosis and fibroids seen in office today for a chief complaint of LLQ pain x one month, which has worsened over the past 5 days. Pain is described as sharp and stabbing. Worsened with movement. Works in an [employer] shop and does lift frequently but does not remember any abnormal lifting. Denies acute injury, increase in heavy lifting, fever, chills, nausea, vomiting, diarrhea, constipation, hematochezia, and melana. Last bowel movement was thsi morning and it was normal for her. Has BM daily. [facility] and 04/12/17 for this pain. Pelvic exam performed, no pain. Normal UA.  She had transvaginal US on 04/12/17, which showed fibroids but nothing acute to cause this pain.  They recommended she follow up with a PCP for the pain.LMP 03/17/17.  Has PSH of cholecystectomy in 2013.Last colonoscopy was 2013 and it showed one polyp. Has not tried anything for relief. Admits to occasional alcohol use.   Review of Systems  Constitutional: Negative for appetite change and diaphoresis.  Respiratory: Negative for cough and shortness of breath.   Cardiovascular: Negative for chest pain and palpitations.  Genitourinary: Negative for difficulty urinating, dysuria, flank pain, frequency, genital sores, hematuria, menstrual problem, pelvic pain, urgency and vaginal discharge.  Neurological: Negative for dizziness and light-headedness.    Patient Active Problem List   Diagnosis Date Noted  . Postop check 05/27/2012  . Symptomatic cholelithiasis 04/29/2012    No current outpatient prescriptions on file prior to visit.   No current facility-administered medications on file prior to visit.     Allergies  Allergen Reactions  . Penicillins Nausea Only and Rash    Rash will spread all over the body.       Social History   Social History  . Marital status: Single      Spouse name: N/A  . Number of children: N/A  . Years of education: N/A   Occupational History  . Not on file.   Social History Main Topics  . Smoking status: Passive Smoke Exposure - Never Smoker    Types: Cigarettes  . Smokeless tobacco: Never Used  . Alcohol use 1.8 oz/week    2 Glasses of wine, 1 Shots of liquor per week     Comment: occ  . Drug use: No  . Sexual activity: Not on file   Other Topics Concern  . Not on file   Social History Narrative  . No narrative on file    Objective:  BP 111/75 (BP Location: Right Arm, Patient Position: Sitting, Cuff Size: Normal)   Pulse 70   Temp 98.1 F (36.7 C) (Oral)   Resp 18   Ht 5' 11.5" (1.816 m)   Wt 217 lb 9.6 oz (98.7 kg)   LMP 03/17/2017 (Exact Date)   SpO2 96%   BMI 29.93 kg/m   Physical Exam  Constitutional: She is oriented to person, place, and time and well-developed, well-nourished, and in no distress.  HENT:  Head: Normocephalic and atraumatic.  Eyes: Conjunctivae are normal.  Neck: Normal range of motion.  Pulmonary/Chest: Effort normal.  Abdominal: Soft. Normal appearance and bowel sounds are normal. There is tenderness (mild) in the left lower quadrant. There is no rigidity, no rebound, no guarding, no CVA tenderness, no tenderness at McBurney's point and negative Murphy's sign. No hernia.    Neurological: She is alert and oriented to person, place, and time.  Gait normal.  Skin: Skin is warm and dry.  Psychiatric: Affect normal.  Vitals reviewed.  Results for orders placed or performed in visit on 04/15/17 (from the past 24 hour(s))  POCT CBC     Status: None   Collection Time: 04/15/17 10:19 AM  Result Value Ref Range   WBC 9.0 4.6 - 10.2 K/uL   Lymph, poc 1.9 0.6 - 3.4   POC LYMPH PERCENT 21.6 10 - 50 %L   MID (cbc) 0.4 0 - 0.9   POC MID % 4.2 0 - 12 %M   POC Granulocyte 6.7 2 - 6.9   Granulocyte percent 74.2 37 - 80 %G   RBC 4.67 4.04 - 5.48 M/uL   Hemoglobin 13.4 12.2 - 16.2 g/dL    HCT, POC 16.1 09.6 - 47.9 %   MCV 84.9 80 - 97 fL   MCH, POC 28.8 27 - 31.2 pg   MCHC 33.9 31.8 - 35.4 g/dL   RDW, POC 04.5 %   Platelet Count, POC 360 142 - 424 K/uL   MPV 7.3 0 - 99.8 fL  POCT urinalysis dipstick     Status: None   Collection Time: 04/15/17 10:21 AM  Result Value Ref Range   Color, UA yellow yellow   Clarity, UA clear clear   Glucose, UA negative negative mg/dL   Bilirubin, UA negative negative   Ketones, POC UA negative negative mg/dL   Spec Grav, UA 4.098 1.191 - 1.025   Blood, UA negative negative   pH, UA 5.5 5.0 - 8.0   Protein Ur, POC negative negative mg/dL   Urobilinogen, UA 0.2 0.2 or 1.0 E.U./dL   Nitrite, UA Negative Negative   Leukocytes, UA Negative Negative    Assessment and Plan :   1. LLQ pain DDx includes diverticulitis vs musculoskeletal. WBC normal. UA normal. Will send for stat CT and contact pt with results and further tx plan.  - POCT urinalysis dipstick - POCT CBC - CT Abdomen Pelvis W Contrast; Future   Update: CT findings of  1.  No acute process or explanation for left lower quadrant pain. 2. Hyperenhancing foci in the uterus are likely fibroids, given the clinical history. 3. Right ovarian corpus luteal cyst. 4. Upper normal appendiceal size, without surrounding inflammation. Most likely within normal variation in this patient with left-sided pain. 5. Jejunal mesenteric findings which likely represent mesenteric adenitis/panniculitis. This is of indeterminate acuity and clinical significance. 6. Fat containing ventral abdominal wall hernia.   Pt contacted and informed of lab results. Suspect musculoskeletal etiology for pain. Will treat with NSAIDs x 2 weeks, ice/heat, and stretching. Naproxen eprescribed. Pt encouraged to return to clinic if symptoms worsen, do not improve, or as needed   Benjiman Core PA-C  [facility] 04/15/2017 10:23 AM

## 2017-04-15 NOTE — Patient Instructions (Addendum)
History and physical exam concerning for diverticulitis. I am going to start you on antibiotics and have you go for STAT CT. I will contact you with the results and discuss further treatment. Thank you for letting me participate in your health and well being.  Go to the [facility] at [facility] for CT scan    IF you received an x-ray today, you will receive an invoice from [facility]. Please contact [facility] at [phone] with questions or concerns regarding your invoice.   IF you received labwork today, you will receive an invoice from [facility]. Please contact [facility] at [phone] with questions or concerns regarding your invoice.   Our billing staff will not be able to assist you with questions regarding bills from these companies.  You will be contacted with the lab results as soon as they are available. The fastest way to get your results is to activate your My Chart account. Instructions are located on the last page of this paperwork. If you have not heard from us regarding the results in 2 weeks, please contact this office.    d Diverticulitis Diverticulitis is when small pockets in your large intestine (colon) get infected or swollen. This causes stomach pain and watery poop (diarrhea). These pouches are called diverticula. They form in people who have a condition called diverticulosis. Follow these instructions at home: Medicines  Take over-the-counter and prescription medicines only as told by your doctor. These include: ? Antibiotics. ? Pain medicines. ? Fiber pills. ? Probiotics. ? Stool softeners.  Do not drive or use heavy machinery while taking prescription pain medicine.  If you were prescribed an antibiotic, take it as told. Do not stop taking it even if you feel better. General instructions  Follow a diet as told by your doctor.  When you feel better, your doctor may tell you to change your diet. You may need to eat a lot  of fiber. Fiber makes it easier to poop (have bowel movements). Healthy foods with fiber include: ? Berries. ? Beans. ? Lentils. ? Green vegetables.  Exercise 3 or more times a week. Aim for 30 minutes each time. Exercise enough to sweat and make your heart beat faster.  Keep all follow-up visits as told. This is important. You may need to have an exam of the large intestine. This is called a colonoscopy. Contact a doctor if:  Your pain does not get better.  You have a hard time eating or drinking.  You are not pooping like normal. Get help right away if:  Your pain gets worse.  Your problems do not get better.  Your problems get worse very fast.  You have a fever.  You throw up (vomit) more than one time.  You have poop that is: ? Bloody. ? Black. ? Tarry. Summary  Diverticulitis is when small pockets in your large intestine (colon) get infected or swollen.  Take medicines only as told by your doctor.  Follow a diet as told by your doctor. This information is not intended to replace advice given to you by your health care provider. Make sure you discuss any questions you have with your health care provider. Document Released: 02/27/2008 Document Revised: 09/27/2016 Document Reviewed: 09/27/2016 Elsevier Interactive Patient Education  2017 [employer]

## 2017-08-06 DIAGNOSIS — R1032 Left lower quadrant pain: Secondary | ICD-10-CM | POA: Diagnosis not present

## 2017-08-06 DIAGNOSIS — K439 Ventral hernia without obstruction or gangrene: Secondary | ICD-10-CM | POA: Diagnosis not present

## 2017-08-06 DIAGNOSIS — Z1211 Encounter for screening for malignant neoplasm of colon: Secondary | ICD-10-CM | POA: Diagnosis not present

## 2017-08-06 DIAGNOSIS — Z86010 Personal history of colonic polyps: Secondary | ICD-10-CM | POA: Diagnosis not present

## 2017-09-23 DIAGNOSIS — Z1211 Encounter for screening for malignant neoplasm of colon: Secondary | ICD-10-CM | POA: Diagnosis not present

## 2017-10-07 DIAGNOSIS — K5730 Diverticulosis of large intestine without perforation or abscess without bleeding: Secondary | ICD-10-CM | POA: Insufficient documentation

## 2017-10-23 ENCOUNTER — Other Ambulatory Visit: Payer: Self-pay

## 2017-10-23 ENCOUNTER — Ambulatory Visit: Payer: BLUE CROSS/BLUE SHIELD | Admitting: Physician Assistant

## 2017-10-23 ENCOUNTER — Encounter: Payer: Self-pay | Admitting: Physician Assistant

## 2017-10-23 VITALS — BP 100/62 | HR 112 | Temp 98.2°F | Resp 18 | Ht 71.5 in | Wt 217.0 lb

## 2017-10-23 DIAGNOSIS — J029 Acute pharyngitis, unspecified: Secondary | ICD-10-CM

## 2017-10-23 DIAGNOSIS — D72829 Elevated white blood cell count, unspecified: Secondary | ICD-10-CM

## 2017-10-23 DIAGNOSIS — J36 Peritonsillar abscess: Secondary | ICD-10-CM | POA: Diagnosis not present

## 2017-10-23 DIAGNOSIS — R509 Fever, unspecified: Secondary | ICD-10-CM

## 2017-10-23 LAB — POCT CBC
GRANULOCYTE PERCENT: 81.9 % — AB (ref 37–80)
HEMATOCRIT: 38.8 % (ref 37.7–47.9)
Hemoglobin: 13.3 g/dL (ref 12.2–16.2)
LYMPH, POC: 1.4 (ref 0.6–3.4)
MCH, POC: 28.9 pg (ref 27–31.2)
MCHC: 34.2 g/dL (ref 31.8–35.4)
MCV: 84.5 fL (ref 80–97)
MID (CBC): 0.8 (ref 0–0.9)
MPV: 6.8 fL (ref 0–99.8)
POC GRANULOCYTE: 9.8 — AB (ref 2–6.9)
POC LYMPH %: 11.7 % (ref 10–50)
POC MID %: 6.4 % (ref 0–12)
Platelet Count, POC: 339 10*3/uL (ref 142–424)
RBC: 4.6 M/uL (ref 4.04–5.48)
RDW, POC: 13.9 %
WBC: 12 10*3/uL — AB (ref 4.6–10.2)

## 2017-10-23 LAB — POCT RAPID STREP A (OFFICE): Rapid Strep A Screen: NEGATIVE

## 2017-10-23 MED ORDER — IBUPROFEN 200 MG PO TABS
800.0000 mg | ORAL_TABLET | Freq: Once | ORAL | Status: AC
  Administered 2017-10-23: 800 mg via ORAL

## 2017-10-23 MED ORDER — CLINDAMYCIN HCL 300 MG PO CAPS: 300.0000 mg | ORAL_CAPSULE | Freq: Three times a day (TID) | ORAL | 0 refills | Status: AC

## 2017-10-23 NOTE — Progress Notes (Signed)
MRN: [id number] DOB: 01/13/1971  Subjective:   Samantha Parker is a 47 y.o. female presenting for chief complaint of Sore Throat (right side ) .  Reports 3 day history of worsening right sided sore throat, pain with swallowing, subjective fever, and chills. Has tried chloraseptic, tylenol, advil with no relief. She can swallow but it is just hurts. Denies voice change, inability to tolerate liquids or foods, neck stiffness, sinus pain, ear pain, wheezing, shortness of breath, chest tightness, chest pain and cough, nausea, vomiting, abdominal pain and diarrhea. Has had sick contact with workers. Has history of seasonal allergies, no history of asthma or COPD. Patient has had flu shot this season. Denies smoking. Denies any other aggravating or relieving factors, no other questions or concerns.  Samantha Parker has a current medication list which includes the following prescription(s): blisovi 24 fe, clindamycin, minocycline, and naproxen. Also is allergic to penicillins.  Samantha Parker  has a past medical history of Contact lens/glasses fitting, Gallstones, and No pertinent past medical history. Also  has a past surgical history that includes Endometrial ablation (2000) and Cholecystectomy (05/08/2012).   Objective:   Vitals: BP 100/62   Pulse (!) 112   Temp 98.2 F (36.8 C)   Resp 18   Ht 5' 11.5" (1.816 m)   Wt 217 lb (98.4 kg)   LMP 09/28/2017   SpO2 97%   BMI 29.84 kg/m   Physical Exam  Constitutional: She is oriented to person, place, and time. She appears well-developed and well-nourished.  Appears like she does not feel well.    HENT:  Head: Normocephalic and atraumatic.  Right Ear: Tympanic membrane, external ear and ear canal normal.  Left Ear: Tympanic membrane, external ear and ear canal normal.  Nose: Rhinorrhea present. No mucosal edema. Right sinus exhibits no maxillary sinus tenderness and no frontal sinus tenderness. Left sinus exhibits no maxillary sinus tenderness and no  frontal sinus tenderness.  Mouth/Throat: Uvula is midline and mucous membranes are normal. No trismus in the jaw. No uvula swelling. Posterior oropharyngeal erythema present. No oropharyngeal exudate, posterior oropharyngeal edema or tonsillar abscesses. Tonsils are 2+ on the right. Tonsils are 2+ on the left. No tonsillar exudate.    Normal voice noted.   Eyes: Conjunctivae are normal.  Neck: Normal range of motion and full passive range of motion without pain.  Pulmonary/Chest: Effort normal.  Lymphadenopathy:       Head (right side): Tonsillar adenopathy present. No submental, no submandibular, no preauricular, no posterior auricular and no occipital adenopathy present.       Head (left side): Tonsillar adenopathy present. No submental, no submandibular, no preauricular, no posterior auricular and no occipital adenopathy present.    She has cervical adenopathy.       Right cervical: Deep cervical adenopathy present. No superficial cervical and no posterior cervical adenopathy present.      Left cervical: Deep cervical adenopathy present. No superficial cervical and no posterior cervical adenopathy present.    She has no axillary adenopathy.       Right: No supraclavicular adenopathy present.       Left: No supraclavicular adenopathy present.  Neurological: She is alert and oriented to person, place, and time.  Skin: Skin is warm and dry.  Psychiatric: She has a normal mood and affect.  Vitals reviewed.   Results for orders placed or performed in visit on 10/23/17 (from the past 24 hour(s))  POCT rapid strep A     Status: None  Collection Time: 10/23/17  4:19 PM  Result Value Ref Range   Rapid Strep A Screen Negative Negative  POCT CBC     Status: Abnormal   Collection Time: 10/23/17  4:52 PM  Result Value Ref Range   WBC 12.0 (A) 4.6 - 10.2 K/uL   Lymph, poc 1.4 0.6 - 3.4   POC LYMPH PERCENT 11.7 10 - 50 %L   MID (cbc) 0.8 0 - 0.9   POC MID % 6.4 0 - 12 %M   POC Granulocyte  9.8 (A) 2 - 6.9   Granulocyte percent 81.9 (A) 37 - 80 %G   RBC 4.60 4.04 - 5.48 M/uL   Hemoglobin 13.3 12.2 - 16.2 g/dL   HCT, POC 16.138.8 09.637.7 - 47.9 %   MCV 84.5 80 - 97 fL   MCH, POC 28.9 27 - 31.2 pg   MCHC 34.2 31.8 - 35.4 g/dL   RDW, POC 04.513.9 %   Platelet Count, POC 339 142 - 424 K/uL   MPV 6.8 0 - 99.8 fL    Assessment and Plan :  1. Sore throat - POCT rapid strep A - POCT CBC - Culture, Group A Strep 2. Peritonsillar cellulitis Physical exam findings consistent with peritonsillar cellulitis.  Rapid strep test negative.  Strep culture pending.  Do not suspect peritonsillar abscess at this as right peritonsillar region is erythematous but with mild swelling compared to left, no fluctuant mass is palpated, and uvula is midline. Do not suspect retropharyngeal abscess. No posterior oropharynx edema, neck stiffness, hot potato voice, or airway compromise noted. Will treat empirically at this time with clindamycin and OTC ibuprofen as prescribed every 6 hours for pain.  Advised to return in 24 hours for reevaluation.  If no improvement at that time consider stat CT or same day referral to ENT.  Given strict return/ED precautions.  Patient voices her understanding. - clindamycin (CLEOCIN) 300 MG capsule; Take 1 capsule (300 mg total) by mouth 3 (three) times daily for 10 days.  Dispense: 30 capsule; Refill: 0  3. Leukocytosis, unspecified type 4. Fever, unspecified Improved with ibuprofen. - ibuprofen (ADVIL,MOTRIN) tablet 800 mg  Benjiman CoreBrittany Jeraline Marcinek, PA-C  Primary Care at [facility] 10/23/2017 10:15 PM

## 2017-10-23 NOTE — Patient Instructions (Addendum)
I recommend you start the antibiotic for your throat today.  Please take this antibiotic with a probiotic as it can increase your every risk for diarrhea.  If you start to develop diffuse diarrhea, please contact our office immediately.  Also recommend you start 800 mg of ibuprofen 6-8 hours for pain and swelling.  I recommend you follow-up tomorrow for reevaluation.  If any of your symptoms worsen or you develop the inability to swallow or changes in your voice or drooling, please seek care immediately at the ED.  Thank you for letting me participate in your health and well being.   Pharyngitis Pharyngitis is a sore throat (pharynx). There is redness, pain, and swelling of your throat. Follow these instructions at home:  Drink enough fluids to keep your pee (urine) clear or pale yellow.  Only take medicine as told by your doctor. ? You may get sick again if you do not take medicine as told. Finish your medicines, even if you start to feel better. ? Do not take aspirin.  Rest.  Rinse your mouth (gargle) with salt water ( tsp of salt per 1 qt of water) every 1-2 hours. This will help the pain.  If you are not at risk for choking, you can suck on hard candy or sore throat lozenges. Contact a doctor if:  You have large, tender lumps on your neck.  You have a rash.  You cough up green, yellow-brown, or bloody spit. Get help right away if:  You have a stiff neck.  You drool or cannot swallow liquids.  You throw up (vomit) or are not able to keep medicine or liquids down.  You have very bad pain that does not go away with medicine.  You have problems breathing (not from a stuffy nose). This information is not intended to replace advice given to you by your health care provider. Make sure you discuss any questions you have with your health care provider. Document Released: 02/27/2008 Document Revised: 02/16/2016 Document Reviewed: 05/18/2013 Elsevier Interactive Patient Education   2017 [employer]    IF you received an x-ray today, you will receive an invoice from [facility]. Please contact [facility] at [phone] with questions or concerns regarding your invoice.   IF you received labwork today, you will receive an invoice from [facility]. Please contact [facility] at [phone] with questions or concerns regarding your invoice.   Our billing staff will not be able to assist you with questions regarding bills from these companies.  You will be contacted with the lab results as soon as they are available. The fastest way to get your results is to activate your My Chart account. Instructions are located on the last page of this paperwork. If you have not heard from us regarding the results in 2 weeks, please contact this office.

## 2017-10-24 ENCOUNTER — Encounter: Payer: Self-pay | Admitting: Physician Assistant

## 2017-10-24 ENCOUNTER — Other Ambulatory Visit: Payer: Self-pay

## 2017-10-24 ENCOUNTER — Ambulatory Visit: Payer: BLUE CROSS/BLUE SHIELD | Admitting: Physician Assistant

## 2017-10-24 VITALS — BP 108/66 | HR 78 | Temp 98.5°F | Resp 18 | Ht 71.5 in | Wt 217.4 lb

## 2017-10-24 DIAGNOSIS — J029 Acute pharyngitis, unspecified: Secondary | ICD-10-CM | POA: Diagnosis not present

## 2017-10-24 DIAGNOSIS — J36 Peritonsillar abscess: Secondary | ICD-10-CM

## 2017-10-24 DIAGNOSIS — D72829 Elevated white blood cell count, unspecified: Secondary | ICD-10-CM

## 2017-10-24 LAB — POCT CBC
Granulocyte percent: 73.3 %G (ref 37–80)
HEMATOCRIT: 37.4 % — AB (ref 37.7–47.9)
Hemoglobin: 12.4 g/dL (ref 12.2–16.2)
LYMPH, POC: 1.8 (ref 0.6–3.4)
MCH, POC: 28.7 pg (ref 27–31.2)
MCHC: 33.1 g/dL (ref 31.8–35.4)
MCV: 86.8 fL (ref 80–97)
MID (CBC): 0.4 (ref 0–0.9)
MPV: 7.2 fL (ref 0–99.8)
POC GRANULOCYTE: 6.1 (ref 2–6.9)
POC LYMPH %: 21.5 % (ref 10–50)
POC MID %: 5.2 % (ref 0–12)
Platelet Count, POC: 344 10*3/uL (ref 142–424)
RBC: 4.31 M/uL (ref 4.04–5.48)
RDW, POC: 14.1 %
WBC: 8.3 10*3/uL (ref 4.6–10.2)

## 2017-10-24 NOTE — Patient Instructions (Addendum)
Complete antibiotic course. Continue taking ibuprofen as prescribed for pain and swelling. Follow up in one week for reevaluation. If any of your symptoms worsen or you develop new concerning symptoms, seek care sooner. Thank you for letting me participate in your health and well being.  Sore Throat When you have a sore throat, your throat may:  Hurt.  Burn.  Feel irritated.  Feel scratchy.  Many things can cause a sore throat, including:  An infection.  Allergies.  Dryness in the air.  Smoke or pollution.  Gastroesophageal reflux disease (GERD).  A tumor.  A sore throat can be the first sign of another sickness. It can happen with other problems, like coughing or a fever. Most sore throats go away without treatment. Follow these instructions at home:  Take over-the-counter medicines only as told by your doctor.  Drink enough fluids to keep your pee (urine) clear or pale yellow.  Rest when you feel you need to.  To help with pain, try: ? Sipping warm liquids, such as broth, herbal tea, or warm water. ? Eating or drinking cold or frozen liquids, such as frozen ice pops. ? Gargling with a salt-water mixture 3-4 times a day or as needed. To make a salt-water mixture, add -1 tsp of salt in 1 cup of warm water. Mix it until you cannot see the salt anymore. ? Sucking on hard candy or throat lozenges. ? Putting a cool-mist humidifier in your bedroom at night. ? Sitting in the bathroom with the door closed for 5-10 minutes while you run hot water in the shower.  Do not use any tobacco products, such as cigarettes, chewing tobacco, and e-cigarettes. If you need help quitting, ask your doctor. Contact a doctor if:  You have a fever for more than 2-3 days.  You keep having symptoms for more than 2-3 days.  Your throat does not get better in 7 days.  You have a fever and your symptoms suddenly get worse. Get help right away if:  You have trouble breathing.  You cannot  swallow fluids, soft foods, or your saliva.  You have swelling in your throat or neck that gets worse.  You keep feeling like you are going to throw up (vomit).  You keep throwing up. This information is not intended to replace advice given to you by your health care provider. Make sure you discuss any questions you have with your health care provider. Document Released: 06/19/2008 Document Revised: 05/06/2016 Document Reviewed: 07/01/2015 Elsevier Interactive Patient Education  2018 [employer]   IF you received an x-ray today, you will receive an invoice from [facility]. Please contact [facility] at [phone] with questions or concerns regarding your invoice.   IF you received labwork today, you will receive an invoice from [facility]. Please contact [facility] at [phone] with questions or concerns regarding your invoice.   Our billing staff will not be able to assist you with questions regarding bills from these companies.  You will be contacted with the lab results as soon as they are available. The fastest way to get your results is to activate your My Chart account. Instructions are located on the last page of this paperwork. If you have not heard from us regarding the results in 2 weeks, please contact this office.

## 2017-10-24 NOTE — Progress Notes (Signed)
Samantha Parker  MRN: [id number] DOB: 01/01/1971  Subjective:  Samantha Parker is a 47 y.o. female seen in office today for a chief complaint of follow-up on sore throat.  Patient presented 1 day ago with severe right-sided sore throat, fever, and chills.  Vitals showed tachycardia and temp elevated at 99.7.  Rapid strep negative.  WBC elevated at 12 with granulocytes.  Physical exam findings showed right peritonsillar erythema and mild swelling.   Treated fo rperitonsillar cellulitis with clindamycin and ibuprofen.  Encouraged to follow-up in 1 day for reevaluation.  Concern was for developing peritonsillar abscess.  Given strict ED precautions.  Please refer to that office note for additional details.  Today, patient reports that she feels tremendously better.  She notes the pain is about 70% better.  She is taking clindamycin and ibuprofen as prescribed.  She is still having some pain with swallowing but she is able to tolerate foods and liquids.  Denies voice change, drooling, neck pain, fever, chills, shortness of breath, chest pain, nausea, and vomiting.  She has had a couple episodes of diarrhea since being on clindamycin but is now taking daily probiotic.  Denies abdominal pain.    Review of Systems  Per HPI  Patient Active Problem List   Diagnosis Date Noted  . Diverticulosis of colon 10/07/2017  . Postop check 05/27/2012  . Symptomatic cholelithiasis 04/29/2012    Current Outpatient Medications on File Prior to Visit  Medication Sig Dispense Refill  . BLISOVI 24 FE 1-20 MG-MCG(24) tablet   1  . clindamycin (CLEOCIN) 300 MG capsule Take 1 capsule (300 mg total) by mouth 3 (three) times daily for 10 days. 30 capsule 0  . minocycline (MINOCIN,DYNACIN) 100 MG capsule TAKE ONE CAPSULE TWICE A DAY WITH FOOD  3  . naproxen (NAPROSYN) 500 MG tablet Take 1 tablet (500 mg total) by mouth 2 (two) times daily with a meal. (Patient not taking: Reported on 10/23/2017) 30 tablet 0   No current  facility-administered medications on file prior to visit.     Allergies  Allergen Reactions  . Penicillins Nausea Only and Rash    Rash will spread all over the body.     Objective:  BP 108/66 (BP Location: Left Arm, Patient Position: Sitting, Cuff Size: Normal)   Pulse 78   Temp 98.5 F (36.9 C) (Oral)   Resp 18   Ht 5' 11.5" (1.816 m)   Wt 217 lb 6.4 oz (98.6 kg)   LMP 09/28/2017   SpO2 98%   BMI 29.90 kg/m   Physical Exam  Constitutional: She is oriented to person, place, and time and well-developed, well-nourished, and in no distress.  She is smiling and laughing during exam. Normal voice noted.   HENT:  Head: Normocephalic and atraumatic.  Mouth/Throat: Uvula is midline and mucous membranes are normal. No trismus in the jaw. No uvula swelling. No posterior oropharyngeal edema, posterior oropharyngeal erythema or tonsillar abscesses.    Tonsils 2+ bilaterally.  No exudates noted.   Eyes: Conjunctivae are normal.  Neck: Normal range of motion.  Pulmonary/Chest: Effort normal.  Lymphadenopathy:       Head (right side): Tonsillar adenopathy present. No submental, no submandibular, no preauricular, no posterior auricular and no occipital adenopathy present.       Head (left side): Tonsillar adenopathy present. No submental, no submandibular, no preauricular, no posterior auricular and no occipital adenopathy present.    She has cervical adenopathy.  Right cervical: Posterior cervical adenopathy present.       Left cervical: Posterior cervical adenopathy present.       Right: No supraclavicular adenopathy present.       Left: No supraclavicular adenopathy present.  Neurological: She is alert and oriented to person, place, and time. Gait normal.  Skin: Skin is warm and dry.  Psychiatric: Affect normal.  Vitals reviewed.    Results for orders placed or performed in visit on 10/24/17 (from the past 24 hour(s))  POCT CBC     Status: Abnormal   Collection Time:  10/24/17  5:33 PM  Result Value Ref Range   WBC 8.3 4.6 - 10.2 K/uL   Lymph, poc 1.8 0.6 - 3.4   POC LYMPH PERCENT 21.5 10 - 50 %L   MID (cbc) 0.4 0 - 0.9   POC MID % 5.2 0 - 12 %M   POC Granulocyte 6.1 2 - 6.9   Granulocyte percent 73.3 37 - 80 %G   RBC 4.31 4.04 - 5.48 M/uL   Hemoglobin 12.4 12.2 - 16.2 g/dL   HCT, POC 16.1 (A) 09.6 - 47.9 %   MCV 86.8 80 - 97 fL   MCH, POC 28.7 27 - 31.2 pg   MCHC 33.1 31.8 - 35.4 g/dL   RDW, POC 04.5 %   Platelet Count, POC 344 142 - 424 K/uL   MPV 7.2 0 - 99.8 fL     Assessment and Plan :  1. Sore throat - POCT CBC 2. Peritonsillar cellulitis Patient looks much better than she did yesterday.  She is overall well-appearing and in no distress today.  Vital stable.  Physical exam findings still reveal erythema of right side peritonsillar region.  The swelling that was noted yesterday has improved.  No fluctuance noted.  WBC decreased from 12.0 yesterday to 8.3 today.  Left shift has resolved.   Patient has clearly responded well to outpatient treatment. No peritonsillar fluctuance noted, uvula is midline, no oropharynx edema, neck stiffness, hot potato voice, or airway compromise noted.  Recommend continue with outpatient treatment.  Follow-up in 1 week for reevaluation.  Given strict return/ED precautions if any symptoms worsen or she develops new concerning symptoms.  Educated to continue clindamycin, ibuprofen, and probiotic. 3. Leukocytosis, unspecified type Resolved.   Benjiman Core PA-C  Primary Care at [facility] 10/24/2017 5:34 PM

## 2017-10-26 LAB — CULTURE, GROUP A STREP: STREP A CULTURE: NEGATIVE

## 2017-10-31 ENCOUNTER — Ambulatory Visit: Payer: BLUE CROSS/BLUE SHIELD | Admitting: Physician Assistant

## 2017-11-02 ENCOUNTER — Ambulatory Visit: Payer: BLUE CROSS/BLUE SHIELD | Admitting: Physician Assistant

## 2017-11-02 ENCOUNTER — Encounter: Payer: Self-pay | Admitting: Physician Assistant

## 2017-11-02 VITALS — BP 106/71 | HR 81 | Temp 97.4°F | Resp 16 | Ht 71.5 in | Wt 214.0 lb

## 2017-11-02 DIAGNOSIS — J029 Acute pharyngitis, unspecified: Secondary | ICD-10-CM | POA: Diagnosis not present

## 2017-11-02 DIAGNOSIS — J36 Peritonsillar abscess: Secondary | ICD-10-CM | POA: Diagnosis not present

## 2017-11-02 NOTE — Patient Instructions (Signed)
     IF you received an x-ray today, you will receive an invoice from [facility]. Please contact [facility] at [phone] with questions or concerns regarding your invoice.   IF you received labwork today, you will receive an invoice from [facility]. Please contact [facility] at [phone] with questions or concerns regarding your invoice.   Our billing staff will not be able to assist you with questions regarding bills from these companies.  You will be contacted with the lab results as soon as they are available. The fastest way to get your results is to activate your My Chart account. Instructions are located on the last page of this paperwork. If you have not heard from us regarding the results in 2 weeks, please contact this office.     

## 2017-11-02 NOTE — Progress Notes (Signed)
Samantha Parker  MRN: [id number] DOB: 01/22/1971  Subjective:  Samantha Parker is a 47 y.o. female seen in office today for a chief complaint of follow-up on sore throat.  Patient presented on 10/23/17 for right-sided sore throat, fever, and chills.  Vitals showed tachycardia and temp elevated at 9.7.  Rapid strep was negative.  WBC elevated at 12 with granulocytes.  Physical exam findings showed right peritonsillar erythema and mild swelling.  She was treated for peritonsillar cellulitis with clindamycin and ibuprofen.  She returned one day later and had significant improvement in her pain. WBC had improved to 8.3.  She was 70% better at that time.  Encouraged to follow-up in 1 week for reevaluation.Today, she notes she is 100% better.  She is asymptomatic.  She denies sore throat, difficulty swallowing, voice change, drooling, neck pain, shortness of breath, fever, chills, diaphoresis, nausea, and vomiting.  She has completed most of her clindamycin course but notes she did miss a couple doses.  She has no other questions or concerns.  Review of Systems  Gastrointestinal: Negative for abdominal pain, diarrhea, nausea and vomiting.    Patient Active Problem List   Diagnosis Date Noted  . Diverticulosis of colon 10/07/2017  . Postop check 05/27/2012  . Symptomatic cholelithiasis 04/29/2012    Current Outpatient Medications on File Prior to Visit  Medication Sig Dispense Refill  . clindamycin (CLEOCIN) 300 MG capsule Take 1 capsule (300 mg total) by mouth 3 (three) times daily for 10 days. 30 capsule 0   No current facility-administered medications on file prior to visit.     Allergies  Allergen Reactions  . Penicillins Nausea Only and Rash    Rash will spread all over the body.     Objective:  BP 106/71   Pulse 81   Temp (!) 97.4 F (36.3 C) (Oral)   Resp 16   Ht 5' 11.5" (1.816 m)   Wt 214 lb (97.1 kg)   SpO2 96%   BMI 29.43 kg/m    Physical Exam  Constitutional: She is  oriented to person, place, and time and well-developed, well-nourished, and in no distress.  HENT:  Head: Normocephalic and atraumatic.  Mouth/Throat: Uvula is midline, oropharynx is clear and moist and mucous membranes are normal. No trismus in the jaw. No uvula swelling. No posterior oropharyngeal edema or posterior oropharyngeal erythema.  Tonsils are 1+ bilaterally.  No tonsillar erythema or exudate noted.  No peritonsillar erythema or swelling noted bilaterally.  Eyes: Conjunctivae are normal.  Neck: Normal range of motion.  Pulmonary/Chest: Effort normal.  Lymphadenopathy:       Head (right side): No submental, no submandibular, no tonsillar, no preauricular, no posterior auricular and no occipital adenopathy present.       Head (left side): No submental, no submandibular, no tonsillar, no preauricular, no posterior auricular and no occipital adenopathy present.    She has no cervical adenopathy.       Right: No supraclavicular adenopathy present.       Left: No supraclavicular adenopathy present.  Neurological: She is alert and oriented to person, place, and time. Gait normal.  Skin: Skin is warm and dry.  Psychiatric: Affect normal.  Vitals reviewed.   Assessment and Plan :  1. Peritonsillar cellulitis 2. Sore throat Resolved.   Vitals are stable.  Patient is well-appearing and in no distress. No acute findings noted on physical exam, which is reassuring.  She reports feeling 100% back to herself.  Follow-up as  needed.  Benjiman Core PA-C  Primary Care at [facility] 11/02/2017 2:03 PM

## 2018-04-09 ENCOUNTER — Ambulatory Visit: Payer: BLUE CROSS/BLUE SHIELD | Admitting: Physician Assistant

## 2018-04-09 ENCOUNTER — Encounter: Payer: Self-pay | Admitting: Physician Assistant

## 2018-04-09 ENCOUNTER — Other Ambulatory Visit: Payer: Self-pay

## 2018-04-09 VITALS — BP 123/81 | HR 85 | Temp 98.1°F | Resp 20 | Ht 71.65 in | Wt 205.0 lb

## 2018-04-09 DIAGNOSIS — F4321 Adjustment disorder with depressed mood: Secondary | ICD-10-CM | POA: Diagnosis not present

## 2018-04-09 MED ORDER — ESCITALOPRAM OXALATE 10 MG PO TABS: 10.0000 mg | ORAL_TABLET | Freq: Every day | ORAL | 0 refills | Status: DC

## 2018-04-09 NOTE — Patient Instructions (Addendum)
For depression and anxiety, I recommend we start a daily SSRI and continue weekly/monthly therapy.  The SSRI I am prescribing is called lexapro. Please keep in mind that when you start an SSRI it can worsen your depression and anxiety symptoms. It can also increase risk of suicidal thoughts so if this occurs, please seek care immediately. Start with lexapro 5 mg daily and after 4 days increase to 10mg  daily. Common side effects of SSRIs include, but are not limited to, GI upset, nausea, vomiting, insomnia, and drowsiness. Typically these side effects will resolve after 2 weeks. Please keep in mind that it can take up to 4-6 weeks for SSRIs to be fully effective.Plan to return to clinic in 4 weeks for reevaluation of symptoms.      IF you received an x-ray today, you will receive an invoice from [facility]. Please contact [facility] at [phone] with questions or concerns regarding your invoice.   IF you received labwork today, you will receive an invoice from [facility]. Please contact [facility] at [phone] with questions or concerns regarding your invoice.   Our billing staff will not be able to assist you with questions regarding bills from these companies.  You will be contacted with the lab results as soon as they are available. The fastest way to get your results is to activate your My Chart account. Instructions are located on the last page of this paperwork. If you have not heard from us regarding the results in 2 weeks, please contact this office.
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
## 2018-06-26 ENCOUNTER — Telehealth: Payer: Self-pay | Admitting: Physician Assistant

## 2018-06-26 ENCOUNTER — Other Ambulatory Visit: Payer: Self-pay | Admitting: *Deleted

## 2018-06-26 DIAGNOSIS — F4321 Adjustment disorder with depressed mood: Secondary | ICD-10-CM

## 2018-06-26 MED ORDER — ESCITALOPRAM OXALATE 10 MG PO TABS: 10.0000 mg | ORAL_TABLET | Freq: Every day | ORAL | 0 refills | Status: DC

## 2018-06-26 NOTE — Telephone Encounter (Signed)
Copied from CRM [id number]. Topic: General - Other >> Jun 26, 2018  8:20 AM Ronney Lion A wrote: Reason for CRM: Patient is requesting a RX refill on her medication escitalopram (LEXAPRO) 10 MG tablet, until her CPE with her PCP. Says she only has 11 pills left and will be out soon.

## 2018-06-26 NOTE — Telephone Encounter (Signed)
Prescription sent for 30 days. ?

## 2018-06-30 ENCOUNTER — Other Ambulatory Visit: Payer: Self-pay | Admitting: Physician Assistant

## 2018-06-30 DIAGNOSIS — F4321 Adjustment disorder with depressed mood: Secondary | ICD-10-CM

## 2018-07-15 ENCOUNTER — Encounter: Payer: Self-pay | Admitting: Physician Assistant

## 2018-07-15 ENCOUNTER — Ambulatory Visit (INDEPENDENT_AMBULATORY_CARE_PROVIDER_SITE_OTHER): Payer: BLUE CROSS/BLUE SHIELD | Admitting: Physician Assistant

## 2018-07-15 ENCOUNTER — Other Ambulatory Visit: Payer: Self-pay

## 2018-07-15 VITALS — BP 104/69 | HR 67 | Temp 97.9°F | Resp 18 | Ht 71.26 in | Wt 184.6 lb

## 2018-07-15 DIAGNOSIS — F4322 Adjustment disorder with anxiety: Secondary | ICD-10-CM

## 2018-07-15 DIAGNOSIS — Z23 Encounter for immunization: Secondary | ICD-10-CM | POA: Diagnosis not present

## 2018-07-15 DIAGNOSIS — Z13228 Encounter for screening for other metabolic disorders: Secondary | ICD-10-CM

## 2018-07-15 DIAGNOSIS — Z130 Encounter for screening for diseases of the blood and blood-forming organs and certain disorders involving the immune mechanism: Secondary | ICD-10-CM | POA: Diagnosis not present

## 2018-07-15 DIAGNOSIS — Z13220 Encounter for screening for lipoid disorders: Secondary | ICD-10-CM

## 2018-07-15 DIAGNOSIS — Z0000 Encounter for general adult medical examination without abnormal findings: Secondary | ICD-10-CM

## 2018-07-15 DIAGNOSIS — Z1329 Encounter for screening for other suspected endocrine disorder: Secondary | ICD-10-CM

## 2018-07-15 DIAGNOSIS — Z1389 Encounter for screening for other disorder: Secondary | ICD-10-CM

## 2018-07-15 MED ORDER — ESCITALOPRAM OXALATE 20 MG PO TABS: 20.0000 mg | ORAL_TABLET | Freq: Every day | ORAL | 1 refills | Status: DC

## 2018-07-15 NOTE — Progress Notes (Signed)
Samantha Parker  MRN: [id number] DOB: 08/24/1971  Subjective:  Pt is a 47 y.o. female who presents for annual physical exam. Pt is fasting today.   Primary Preventative Screenings: Cervical Cancer: 2019, followed by gyn Family Planning: Still having menses, they are regular. Not currently sexually active.  STI screening: No concern for STDS Breast Cancer: 2019, followed by gyn Colorectal Cancer: 2018, +polyp, rec q 5 years, followed by GI Tobacco use/EtOH/substances: Drinks alcohol occ, denies illicit drug use and tobacco use.  Bone Density: n/a  Weight/Blood sugar/Diet/Exercise: Has been focusing on low carb diet and exercising 5+ days a week for the past 3 months and has lost 30+ lbs.  OTC/Vit/Supp/Herbal: n/a Dentist/Optho: dentist every 6 mths, eye exam annually Immunizations: Not up to date on td vaccine. Had flu shot last year.   Anxiety and depression:  Currently taking Lexapro 10 mg daily.  She is in therapy once weekly.  Feels like she needs to increase her Lexapro dose.  Has been feeling more anxious lately.  There have been multiple deaths of family and friends recently.   Has been more anxious lately, having more worrying.  Not having many sad thoughts. Able to get out of bed and do her daily activities, no decreased pleasure. Denies SI or HI.   Patient Active Problem List   Diagnosis Date Noted  . Diverticulosis of colon 10/07/2017  . Postop check 05/27/2012  . Symptomatic cholelithiasis 04/29/2012    No current outpatient medications on file prior to visit.   No current facility-administered medications on file prior to visit.     Allergies  Allergen Reactions  . Penicillins Nausea Only and Rash    Rash will spread all over the body.    Social History   Socioeconomic History  . Marital status: Single    Spouse name: Not on file  . Number of children: 0  . Years of education: Not on file  . Highest education level: Not on file  Occupational History  .  Not on file  Social Needs  . Financial resource strain: Not hard at all  . Food insecurity:    Worry: Never true    Inability: Never true  . Transportation needs:    Medical: No    Non-medical: No  Tobacco Use  . Smoking status: Passive Smoke Exposure - Never Smoker  . Smokeless tobacco: Never Used  Substance and Sexual Activity  . Alcohol use: Yes    Alcohol/week: 3.0 standard drinks    Types: 2 Glasses of wine, 1 Shots of liquor per week    Comment: occ  . Drug use: No  . Sexual activity: Not Currently  Lifestyle  . Physical activity:    Days per week: 5 days    Minutes per session: 60 min  . Stress: To some extent  Relationships  . Social connections:    Talks on phone: More than three times a week    Gets together: More than three times a week    Attends religious service: More than 4 times per year    Active member of club or organization: Yes    Attends meetings of clubs or organizations: More than 4 times per year    Relationship status: Never married  Other Topics Concern  . Not on file  Social History Narrative  . Not on file    Past Surgical History:  Procedure Laterality Date  . CHOLECYSTECTOMY  05/08/2012   Procedure: LAPAROSCOPIC CHOLECYSTECTOMY WITH INTRAOPERATIVE CHOLANGIOGRAM;  Surgeon: Gwenyth Ober, MD;  Location: [place];  Service: General;  Laterality: N/A;  . ENDOMETRIAL ABLATION  2000    Family History  Problem Relation Age of Onset  . Breast cancer Mother 24  . Diabetes Father   . Hypertension Father   . Hyperlipidemia Father   . Cancer Maternal Grandmother        breast    Review of Systems  Constitutional: Negative for activity change, appetite change, chills, diaphoresis, fatigue, fever and unexpected weight change.  HENT: Negative for congestion, dental problem, drooling, ear discharge, ear pain, facial swelling, hearing loss, mouth sores, nosebleeds, postnasal drip, rhinorrhea, sinus pressure, sinus pain, sneezing,  sore throat, tinnitus, trouble swallowing and voice change.   Eyes: Negative for photophobia, pain, discharge, redness, itching and visual disturbance.  Respiratory: Negative for apnea, cough, choking, chest tightness, shortness of breath, wheezing and stridor.   Cardiovascular: Negative for chest pain, palpitations and leg swelling.  Gastrointestinal: Negative for abdominal distention, abdominal pain, anal bleeding, blood in stool, constipation, diarrhea, nausea, rectal pain and vomiting.  Endocrine: Negative for cold intolerance, heat intolerance, polydipsia, polyphagia and polyuria.  Genitourinary: Negative for decreased urine volume, difficulty urinating, dyspareunia, dysuria, enuresis, flank pain, frequency, genital sores, hematuria, menstrual problem, pelvic pain, urgency, vaginal bleeding, vaginal discharge and vaginal pain.  Musculoskeletal: Negative for arthralgias, back pain, gait problem, joint swelling, myalgias, neck pain and neck stiffness.  Skin: Negative for color change, pallor, rash and wound.  Allergic/Immunologic: Negative for environmental allergies, food allergies and immunocompromised state.  Neurological: Negative for dizziness, tremors, seizures, syncope, facial asymmetry, speech difficulty, weakness, light-headedness, numbness and headaches.  Hematological: Negative for adenopathy. Does not bruise/bleed easily.  Psychiatric/Behavioral: Negative for agitation, behavioral problems, confusion, decreased concentration, dysphoric mood, hallucinations, self-injury, sleep disturbance and suicidal ideas. The patient is not nervous/anxious and is not hyperactive.     Objective:  BP 104/69   Pulse 67   Temp 97.9 F (36.6 C) (Oral)   Resp 18   Ht 5' 11.26" (1.81 m)   Wt 184 lb 9.6 oz (83.7 kg)   LMP 06/28/2018   SpO2 97%   BMI 25.56 kg/m   Physical Exam  Constitutional: She is oriented to person, place, and time. She appears well-developed and well-nourished. No distress.    HENT:  Head: Normocephalic and atraumatic.  Right Ear: Hearing, tympanic membrane, external ear and ear canal normal.  Left Ear: Hearing, tympanic membrane, external ear and ear canal normal.  Nose: Nose normal.  Mouth/Throat: Uvula is midline, oropharynx is clear and moist and mucous membranes are normal. No oropharyngeal exudate.  Eyes: Pupils are equal, round, and reactive to light. Conjunctivae, EOM and lids are normal. No scleral icterus.  Neck: Trachea normal and normal range of motion. No thyroid mass and no thyromegaly present.  Cardiovascular: Normal rate, regular rhythm, normal heart sounds and intact distal pulses.  Pulmonary/Chest: Effort normal and breath sounds normal.  Abdominal: Soft. Normal appearance and bowel sounds are normal. There is no tenderness.  Genitourinary:  Genitourinary Comments: Deferred GU and breast exam.   Lymphadenopathy:       Head (right side): No tonsillar, no preauricular, no posterior auricular and no occipital adenopathy present.       Head (left side): No tonsillar, no preauricular, no posterior auricular and no occipital adenopathy present.    She has no cervical adenopathy.       Right: No supraclavicular adenopathy present.       Left: No  supraclavicular adenopathy present.  Neurological: She is alert and oriented to person, place, and time. She has normal strength and normal reflexes.  Skin: Skin is warm and dry.    Visual Acuity Screening   Right eye Left eye Both eyes  Without correction:     With correction: '20/15 20/13 20/13 '   Wt Readings from Last 3 Encounters:  07/15/18 184 lb 9.6 oz (83.7 kg)  04/09/18 205 lb (93 kg)  11/02/17 214 lb (97.1 kg)   GAD 7 : Generalized Anxiety Score 07/15/2018 04/09/2018  Nervous, Anxious, on Edge 2 1  Control/stop worrying 2 1  Worry too much - different things 1 1  Trouble relaxing 1 1  Restless 3 0  Easily annoyed or irritable 3 2  Afraid - awful might happen 1 1  Total GAD 7 Score 13 7   Anxiety Difficulty - Not difficult at all      Assessment and Plan :  Discussed healthy lifestyle, diet, exercise, preventative care, vaccinations, and addressed patient's concerns. Plan for follow up in 3-6 months.  Otherwise, plan for specific conditions below.  1. Annual physical exam Await lab results.   2. Screening for deficiency anemia - CBC with Differential/Platelet  3. Screening cholesterol level - Lipid panel  4. Screening for thyroid disorder - TSH  5. Screening for hematuria or proteinuria - Urinalysis, dipstick only  6. Screening for metabolic disorder - [id number]  7. Need for Td vaccine - Td vaccine greater than or equal to 7yo preservative free IM  8. Flu vaccine need - Flu Vaccine QUAD 36+ mos IM  9. Adjustment disorder with anxious mood Pt would likely benefit from increase in medication dose. GAD score has increased to 14. Rec increasing lexapro to 30m daily. Continue with therapy. No SI or HI. Follow up in 3-6 months or as needed.  - escitalopram (LEXAPRO) 20 MG tablet; Take 1 tablet (20 mg total) by mouth daily.  Dispense: 90 tablet; Refill: 1[place] PA-C  [facility] 2:26 PM

## 2018-07-15 NOTE — Patient Instructions (Addendum)
Increase lexapro to 31m daily. Continue with therapy.   Follow up with me in 3-6 months.   Thank you for letting me participate in your health and well being.    If you have lab work done today you will be contacted with your lab results within the next 2 weeks.  If you have not heard from [facility] please contact [facility] The fastest way to get your results is to register for My Chart. Health Maintenance, Female Adopting a healthy lifestyle and getting preventive care can go a long way to promote health and wellness. Talk with your health care provider about what schedule of regular examinations is right for you. This is a good chance for you to check in with your provider about disease prevention and staying healthy. In between checkups, there are plenty of things you can do on your own. Experts have done a lot of research about which lifestyle changes and preventive measures are most likely to keep you healthy. Ask your health care provider for more information. Weight and diet Eat a healthy diet  Be sure to include plenty of vegetables, fruits, low-fat dairy products, and lean protein.  Do not eat a lot of foods high in solid fats, added sugars, or salt.  Get regular exercise. This is one of the most important things you can do for your health. ? Most adults should exercise for at least 150 minutes each week. The exercise should increase your heart rate and make you sweat (moderate-intensity exercise). ? Most adults should also do strengthening exercises at least twice a week. This is in addition to the moderate-intensity exercise.  Maintain a healthy weight  Body mass index (BMI) is a measurement that can be used to identify possible weight problems. It estimates body fat based on height and weight. Your health care provider can help determine your BMI and help you achieve or maintain a healthy weight.  For females 269years of age and older: ? A BMI below 18.5 is considered  underweight. ? A BMI of 18.5 to 24.9 is normal. ? A BMI of 25 to 29.9 is considered overweight. ? A BMI of 30 and above is considered obese.  Watch levels of cholesterol and blood lipids  You should start having your blood tested for lipids and cholesterol at 49years of age, then have this test every 5 years.  You may need to have your cholesterol levels checked more often if: ? Your lipid or cholesterol levels are high. ? You are older than 49years of age. ? You are at high risk for heart disease.  Cancer screening Lung Cancer  Lung cancer screening is recommended for adults 539824years old who are at high risk for lung cancer because of a history of smoking.  A yearly low-dose CT scan of the lungs is recommended for people who: ? Currently smoke. ? Have quit within the past 15 years. ? Have at least a 30-pack-year history of smoking. A pack year is smoking an average of one pack of cigarettes a day for 1 year.  Yearly screening should continue until it has been 15 years since you quit.  Yearly screening should stop if you develop a health problem that would prevent you from having lung cancer treatment.  Breast Cancer  Practice breast self-awareness. This means understanding how your breasts normally appear and feel.  It also means doing regular breast self-exams. Let your health care provider know about any changes, no matter how small.  If you are in your 20s or 30s, you should have a clinical breast exam (CBE) by a health care provider every 1-3 years as part of a regular health exam.  If you are 40 or older, have a CBE every year. Also consider having a breast X-ray (mammogram) every year.  If you have a family history of breast cancer, talk to your health care provider about genetic screening.  If you are at high risk for breast cancer, talk to your health care provider about having an MRI and a mammogram every year.  Breast cancer gene (BRCA) assessment is  recommended for women who have family members with BRCA-related cancers. BRCA-related cancers include: ? Breast. ? Ovarian. ? Tubal. ? Peritoneal cancers.  Results of the assessment will determine the need for genetic counseling and BRCA1 and BRCA2 testing.  Cervical Cancer Your health care provider may recommend that you be screened regularly for cancer of the pelvic organs (ovaries, uterus, and vagina). This screening involves a pelvic examination, including checking for microscopic changes to the surface of your cervix (Pap test). You may be encouraged to have this screening done every 3 years, beginning at age 21.  For women ages 30-65, health care providers may recommend pelvic exams and Pap testing every 3 years, or they may recommend the Pap and pelvic exam, combined with testing for human papilloma virus (HPV), every 5 years. Some types of HPV increase your risk of cervical cancer. Testing for HPV may also be done on women of any age with unclear Pap test results.  Other health care providers may not recommend any screening for nonpregnant women who are considered low risk for pelvic cancer and who do not have symptoms. Ask your health care provider if a screening pelvic exam is right for you.  If you have had past treatment for cervical cancer or a condition that could lead to cancer, you need Pap tests and screening for cancer for at least 20 years after your treatment. If Pap tests have been discontinued, your risk factors (such as having a new sexual partner) need to be reassessed to determine if screening should resume. Some women have medical problems that increase the chance of getting cervical cancer. In these cases, your health care provider may recommend more frequent screening and Pap tests.  Colorectal Cancer  This type of cancer can be detected and often prevented.  Routine colorectal cancer screening usually begins at 50 years of age and continues through 49 years of  age.  Your health care provider may recommend screening at an earlier age if you have risk factors for colon cancer.  Your health care provider may also recommend using home test kits to check for hidden blood in the stool.  A small camera at the end of a tube can be used to examine your colon directly (sigmoidoscopy or colonoscopy). This is done to check for the earliest forms of colorectal cancer.  Routine screening usually begins at age 50.  Direct examination of the colon should be repeated every 5-10 years through 49 years of age. However, you may need to be screened more often if early forms of precancerous polyps or small growths are found.  Skin Cancer  Check your skin from head to toe regularly.  Tell your health care provider about any new moles or changes in moles, especially if there is a change in a mole's shape or color.  Also tell your health care provider if you have a mole that is   larger than the size of a pencil eraser.  Always use sunscreen. Apply sunscreen liberally and repeatedly throughout the day.  Protect yourself by wearing long sleeves, pants, a wide-brimmed hat, and sunglasses whenever you are outside.  Heart disease, diabetes, and high blood pressure  High blood pressure causes heart disease and increases the risk of stroke. High blood pressure is more likely to develop in: ? People who have blood pressure in the high end of the normal range (130-139/85-89 mm Hg). ? People who are overweight or obese. ? People who are African American.  If you are 18-39 years of age, have your blood pressure checked every 3-5 years. If you are 40 years of age or older, have your blood pressure checked every year. You should have your blood pressure measured twice-once when you are at a hospital or clinic, and once when you are not at a hospital or clinic. Record the average of the two measurements. To check your blood pressure when you are not at a hospital or clinic, you  can use: ? An automated blood pressure machine at a pharmacy. ? A home blood pressure monitor.  If you are between 55 years and 79 years old, ask your health care provider if you should take aspirin to prevent strokes.  Have regular diabetes screenings. This involves taking a blood sample to check your fasting blood sugar level. ? If you are at a normal weight and have a low risk for diabetes, have this test once every three years after 49 years of age. ? If you are overweight and have a high risk for diabetes, consider being tested at a younger age or more often. Preventing infection Hepatitis B  If you have a higher risk for hepatitis B, you should be screened for this virus. You are considered at high risk for hepatitis B if: ? You were born in a country where hepatitis B is common. Ask your health care provider which countries are considered high risk. ? Your parents were born in a high-risk country, and you have not been immunized against hepatitis B (hepatitis B vaccine). ? You have HIV or AIDS. ? You use needles to inject street drugs. ? You live with someone who has hepatitis B. ? You have had sex with someone who has hepatitis B. ? You get hemodialysis treatment. ? You take certain medicines for conditions, including cancer, organ transplantation, and autoimmune conditions.  Hepatitis C  Blood testing is recommended for: ? Everyone born from 1945 through 1965. ? Anyone with known risk factors for hepatitis C.  Sexually transmitted infections (STIs)  You should be screened for sexually transmitted infections (STIs) including gonorrhea and chlamydia if: ? You are sexually active and are younger than 49 years of age. ? You are older than 49 years of age and your health care provider tells you that you are at risk for this type of infection. ? Your sexual activity has changed since you were last screened and you are at an increased risk for chlamydia or gonorrhea. Ask your  health care provider if you are at risk.  If you do not have HIV, but are at risk, it may be recommended that you take a prescription medicine daily to prevent HIV infection. This is called pre-exposure prophylaxis (PrEP). You are considered at risk if: ? You are sexually active and do not regularly use condoms or know the HIV status of your partner(s). ? You take drugs by injection. ? You are sexually active   with a partner who has HIV.  Talk with your health care provider about whether you are at high risk of being infected with HIV. If you choose to begin PrEP, you should first be tested for HIV. You should then be tested every 3 months for as long as you are taking PrEP. Pregnancy  If you are premenopausal and you may become pregnant, ask your health care provider about preconception counseling.  If you may become pregnant, take 400 to 800 micrograms (mcg) of folic acid every day.  If you want to prevent pregnancy, talk to your health care provider about birth control (contraception). Osteoporosis and menopause  Osteoporosis is a disease in which the bones lose minerals and strength with aging. This can result in serious bone fractures. Your risk for osteoporosis can be identified using a bone density scan.  If you are 80 years of age or older, or if you are at risk for osteoporosis and fractures, ask your health care provider if you should be screened.  Ask your health care provider whether you should take a calcium or vitamin D supplement to lower your risk for osteoporosis.  Menopause may have certain physical symptoms and risks.  Hormone replacement therapy may reduce some of these symptoms and risks. Talk to your health care provider about whether hormone replacement therapy is right for you. Follow these instructions at home:  Schedule regular health, dental, and eye exams.  Stay current with your immunizations.  Do not use any tobacco products including cigarettes, chewing  tobacco, or electronic cigarettes.  If you are pregnant, do not drink alcohol.  If you are breastfeeding, limit how much and how often you drink alcohol.  Limit alcohol intake to no more than 1 drink per day for nonpregnant women. One drink equals 12 ounces of beer, 5 ounces of wine, or 1 ounces of hard liquor.  Do not use street drugs.  Do not share needles.  Ask your health care provider for help if you need support or information about quitting drugs.  Tell your health care provider if you often feel depressed.  Tell your health care provider if you have ever been abused or do not feel safe at home. This information is not intended to replace advice given to you by your health care provider. Make sure you discuss any questions you have with your health care provider. Document Released: 03/26/2011 Document Revised: 02/16/2016 Document Reviewed: 06/14/2015 Elsevier Interactive Patient Education  2018 [employer].   IF you received an x-ray today, you will receive an invoice from [facility]. Please contact [facility] at [phone] with questions or concerns regarding your invoice.   IF you received labwork today, you will receive an invoice from [facility] [place]. Please contact [facility] at [phone] with questions or concerns regarding your invoice.   Our billing staff will not be able to assist you with questions regarding bills from these companies.  You will be contacted with the lab results as soon as they are available. The fastest way to get your results is to activate your My Chart account. Instructions are located on the last page of this paperwork. If you have not heard from [place] regarding the results in 2 weeks, please contact this office.

## 2018-07-16 LAB — CMP14+EGFR
ALBUMIN: 4.4 g/dL (ref 3.5–5.5)
ALK PHOS: 87 IU/L (ref 39–117)
ALT: 34 IU/L — ABNORMAL HIGH (ref 0–32)
AST: 18 IU/L (ref 0–40)
Albumin/Globulin Ratio: 1.9 (ref 1.2–2.2)
BUN / CREAT RATIO: 14 (ref 9–23)
BUN: 11 mg/dL (ref 6–24)
Bilirubin Total: 0.3 mg/dL (ref 0.0–1.2)
CO2: 24 mmol/L (ref 20–29)
Calcium: 9.6 mg/dL (ref 8.7–10.2)
Chloride: 103 mmol/L (ref 96–106)
Creatinine, Ser: 0.76 mg/dL (ref 0.57–1.00)
GFR calc Af Amer: 107 mL/min/{1.73_m2} (ref >59)
GFR calc non Af Amer: 92 mL/min/{1.73_m2} (ref >59)
GLUCOSE: 91 mg/dL (ref 65–99)
Globulin, Total: 2.3 g/dL (ref 1.5–4.5)
Potassium: 4.5 mmol/L (ref 3.5–5.2)
Sodium: 141 mmol/L (ref 134–144)
TOTAL PROTEIN: 6.7 g/dL (ref 6.0–8.5)

## 2018-07-16 LAB — LIPID PANEL
CHOLESTEROL TOTAL: 174 mg/dL (ref 100–199)
Chol/HDL Ratio: 2.5 ratio (ref 0.0–4.4)
HDL: 71 mg/dL (ref >39)
LDL Calculated: 87 mg/dL (ref 0–99)
TRIGLYCERIDES: 82 mg/dL (ref 0–149)
VLDL CHOLESTEROL CAL: 16 mg/dL (ref 5–40)

## 2018-07-16 LAB — CBC WITH DIFFERENTIAL/PLATELET
BASOS: 1 %
Basophils Absolute: 0 10*3/uL (ref 0.0–0.2)
EOS (ABSOLUTE): 0.1 10*3/uL (ref 0.0–0.4)
EOS: 1 %
Hematocrit: 42.8 % (ref 34.0–46.6)
Hemoglobin: 14 g/dL (ref 11.1–15.9)
IMMATURE GRANS (ABS): 0 10*3/uL (ref 0.0–0.1)
IMMATURE GRANULOCYTES: 0 %
LYMPHS: 22 %
Lymphocytes Absolute: 1.4 10*3/uL (ref 0.7–3.1)
MCH: 29.5 pg (ref 26.6–33.0)
MCHC: 32.7 g/dL (ref 31.5–35.7)
MCV: 90 fL (ref 79–97)
Monocytes Absolute: 0.4 10*3/uL (ref 0.1–0.9)
Monocytes: 7 %
NEUTROS PCT: 69 %
Neutrophils Absolute: 4.5 10*3/uL (ref 1.4–7.0)
PLATELETS: 359 10*3/uL (ref 150–450)
RBC: 4.75 x10E6/uL (ref 3.77–5.28)
RDW: 12.9 % (ref 12.3–15.4)
WBC: 6.5 10*3/uL (ref 3.4–10.8)

## 2018-07-16 LAB — URINALYSIS, DIPSTICK ONLY
Bilirubin, UA: NEGATIVE
Glucose, UA: NEGATIVE
Ketones, UA: NEGATIVE
LEUKOCYTES UA: NEGATIVE
Nitrite, UA: NEGATIVE
PH UA: 5 (ref 5.0–7.5)
PROTEIN UA: NEGATIVE
RBC, UA: NEGATIVE
SPEC GRAV UA: 1.02 (ref 1.005–1.030)
Urobilinogen, Ur: 0.2 mg/dL (ref 0.2–1.0)

## 2018-07-16 LAB — TSH: TSH: 2.48 u[IU]/mL (ref 0.450–4.500)

## 2018-07-17 ENCOUNTER — Encounter: Payer: Self-pay | Admitting: *Deleted

## 2018-07-29 DIAGNOSIS — F4323 Adjustment disorder with mixed anxiety and depressed mood: Secondary | ICD-10-CM | POA: Diagnosis not present

## 2018-08-05 DIAGNOSIS — F4323 Adjustment disorder with mixed anxiety and depressed mood: Secondary | ICD-10-CM | POA: Diagnosis not present

## 2018-08-12 DIAGNOSIS — F4323 Adjustment disorder with mixed anxiety and depressed mood: Secondary | ICD-10-CM | POA: Diagnosis not present

## 2018-08-19 DIAGNOSIS — F4323 Adjustment disorder with mixed anxiety and depressed mood: Secondary | ICD-10-CM | POA: Diagnosis not present

## 2018-08-26 DIAGNOSIS — F4323 Adjustment disorder with mixed anxiety and depressed mood: Secondary | ICD-10-CM | POA: Diagnosis not present

## 2018-09-03 DIAGNOSIS — F4323 Adjustment disorder with mixed anxiety and depressed mood: Secondary | ICD-10-CM | POA: Diagnosis not present

## 2018-09-04 DIAGNOSIS — L570 Actinic keratosis: Secondary | ICD-10-CM | POA: Diagnosis not present

## 2018-09-04 DIAGNOSIS — L400 Psoriasis vulgaris: Secondary | ICD-10-CM | POA: Diagnosis not present

## 2018-09-04 DIAGNOSIS — L218 Other seborrheic dermatitis: Secondary | ICD-10-CM | POA: Diagnosis not present

## 2018-09-04 DIAGNOSIS — L821 Other seborrheic keratosis: Secondary | ICD-10-CM | POA: Diagnosis not present

## 2018-09-10 DIAGNOSIS — F4323 Adjustment disorder with mixed anxiety and depressed mood: Secondary | ICD-10-CM | POA: Diagnosis not present

## 2018-09-30 DIAGNOSIS — F4323 Adjustment disorder with mixed anxiety and depressed mood: Secondary | ICD-10-CM | POA: Diagnosis not present

## 2018-10-14 DIAGNOSIS — F4323 Adjustment disorder with mixed anxiety and depressed mood: Secondary | ICD-10-CM | POA: Diagnosis not present

## 2018-10-16 DIAGNOSIS — L700 Acne vulgaris: Secondary | ICD-10-CM | POA: Diagnosis not present

## 2018-11-04 DIAGNOSIS — F4323 Adjustment disorder with mixed anxiety and depressed mood: Secondary | ICD-10-CM | POA: Diagnosis not present

## 2018-11-18 DIAGNOSIS — F4323 Adjustment disorder with mixed anxiety and depressed mood: Secondary | ICD-10-CM | POA: Diagnosis not present

## 2018-12-09 DIAGNOSIS — F4323 Adjustment disorder with mixed anxiety and depressed mood: Secondary | ICD-10-CM | POA: Diagnosis not present

## 2019-01-07 ENCOUNTER — Other Ambulatory Visit: Payer: Self-pay | Admitting: Physician Assistant

## 2019-01-07 DIAGNOSIS — F4322 Adjustment disorder with anxiety: Secondary | ICD-10-CM

## 2019-04-15 DIAGNOSIS — F4323 Adjustment disorder with mixed anxiety and depressed mood: Secondary | ICD-10-CM | POA: Diagnosis not present

## 2019-04-17 ENCOUNTER — Other Ambulatory Visit: Payer: Self-pay

## 2019-04-17 ENCOUNTER — Ambulatory Visit: Payer: BC Managed Care – PPO | Admitting: Registered Nurse

## 2019-04-17 ENCOUNTER — Encounter: Payer: Self-pay | Admitting: Registered Nurse

## 2019-04-17 VITALS — BP 98/61 | HR 78 | Temp 97.6°F | Resp 18 | Ht 71.0 in | Wt 175.0 lb

## 2019-04-17 DIAGNOSIS — M546 Pain in thoracic spine: Secondary | ICD-10-CM

## 2019-04-17 MED ORDER — CYCLOBENZAPRINE HCL 5 MG PO TABS: 5.0000 mg | ORAL_TABLET | Freq: Three times a day (TID) | ORAL | 0 refills | Status: DC | PRN

## 2019-04-17 NOTE — Progress Notes (Signed)
Established Patient Office Visit  Subjective:  Patient ID: Samantha Parker, female    DOB: 02-Apr-1971  Age: 48 y.o. MRN: [id number]  CC:  Chief Complaint  Patient presents with  . Back Pain    pt states she has been having this back pain x 2 weeks that is very intence     HPI Samantha Parker presents for 2 weeks of worsening back pain. She states that it starts near her spine and radiates down her arm. Denies preceding injury. Endorses numbness in arm and hand. Denies weakness. Full ROM. Worse with activity.  States she has had cortisone injections in past - does not know when her last back imaging has been. Requests orthopedic referral States she has tried Tylenol, which has helped. Ibuprofen not helpful. Rest helpful. States she is very active and this has been problematic for her.  Past Medical History:  Diagnosis Date  . Contact lens/glasses fitting   . Gallstones   . No pertinent past medical history     Past Surgical History:  Procedure Laterality Date  . CHOLECYSTECTOMY  05/08/2012   Procedure: LAPAROSCOPIC CHOLECYSTECTOMY WITH INTRAOPERATIVE CHOLANGIOGRAM;  Surgeon: Gwenyth Ober, MD;  Location: [place];  Service: General;  Laterality: N/A;  . ENDOMETRIAL ABLATION  2000    Family History  Problem Relation Age of Onset  . Breast cancer Mother 56  . Diabetes Father   . Hypertension Father   . Hyperlipidemia Father   . Cancer Maternal Grandmother        breast    Social History   Socioeconomic History  . Marital status: Single    Spouse name: Not on file  . Number of children: 0  . Years of education: Not on file  . Highest education level: Not on file  Occupational History  . Not on file  Social Needs  . Financial resource strain: Not hard at all  . Food insecurity    Worry: Never true    Inability: Never true  . Transportation needs    Medical: No    Non-medical: No  Tobacco Use  . Smoking status: Passive Smoke Exposure - Never  Smoker  . Smokeless tobacco: Never Used  Substance and Sexual Activity  . Alcohol use: Yes    Alcohol/week: 3.0 standard drinks    Types: 2 Glasses of wine, 1 Shots of liquor per week    Comment: occ  . Drug use: No  . Sexual activity: Not Currently  Lifestyle  . Physical activity    Days per week: 5 days    Minutes per session: 60 min  . Stress: To some extent  Relationships  . Social connections    Talks on phone: More than three times a week    Gets together: More than three times a week    Attends religious service: More than 4 times per year    Active member of club or organization: Yes    Attends meetings of clubs or organizations: More than 4 times per year    Relationship status: Never married  . Intimate partner violence    Fear of current or ex partner: Patient refused    Emotionally abused: Patient refused    Physically abused: Patient refused    Forced sexual activity: Patient refused  Other Topics Concern  . Not on file  Social History Narrative  . Not on file    Outpatient Medications Prior to Visit  Medication Sig Dispense Refill  .  escitalopram (LEXAPRO) 20 MG tablet TAKE 1 TABLET BY MOUTH EVERY DAY 90 tablet 1   No facility-administered medications prior to visit.     Allergies  Allergen Reactions  . Penicillins Nausea Only and Rash    Rash will spread all over the body.    ROS Review of Systems Per hpi   Objective:    Physical Exam  Constitutional: She appears well-developed and well-nourished. No distress.  Cardiovascular: Normal rate and regular rhythm.  Musculoskeletal: Normal range of motion.        General: Tenderness present. No deformity or edema.  Skin: She is not diaphoretic.  Nursing note and vitals reviewed.   BP 98/61   Pulse 78   Temp 97.6 F (36.4 C) (Oral)   Resp 18   Ht 5\' 11"  (1.803 m)   Wt 175 lb (79.4 kg)   LMP  (LMP Unknown)   SpO2 95%   BMI 24.41 kg/m  Wt Readings from Last 3 Encounters:  04/17/19 175 lb  (79.4 kg)  07/15/18 184 lb 9.6 oz (83.7 kg)  04/09/18 205 lb (93 kg)     There are no preventive care reminders to display for this patient.  There are no preventive care reminders to display for this patient.  Lab Results  Component Value Date   TSH 2.480 07/15/2018   Lab Results  Component Value Date   WBC 6.5 07/15/2018   HGB 14.0 07/15/2018   HCT 42.8 07/15/2018   MCV 90 07/15/2018   PLT 359 07/15/2018   Lab Results  Component Value Date   NA 141 07/15/2018   K 4.5 07/15/2018   CO2 24 07/15/2018   GLUCOSE 91 07/15/2018   BUN 11 07/15/2018   CREATININE 0.76 07/15/2018   BILITOT 0.3 07/15/2018   ALKPHOS 87 07/15/2018   AST 18 07/15/2018   ALT 34 (H) 07/15/2018   PROT 6.7 07/15/2018   ALBUMIN 4.4 07/15/2018   CALCIUM 9.6 07/15/2018   Lab Results  Component Value Date   CHOL 174 07/15/2018   Lab Results  Component Value Date   HDL 71 07/15/2018   Lab Results  Component Value Date   LDLCALC 87 07/15/2018   Lab Results  Component Value Date   TRIG 82 07/15/2018   Lab Results  Component Value Date   CHOLHDL 2.5 07/15/2018   No results found for: HGBA1C    Assessment & Plan:   Problem List Items Addressed This Visit    None    Visit Diagnoses    Acute left-sided thoracic back pain    -  Primary   Relevant Medications   cyclobenzaprine (FLEXERIL) 5 MG tablet      Meds ordered this encounter  Medications  . cyclobenzaprine (FLEXERIL) 5 MG tablet    Sig: Take 1 tablet (5 mg total) by mouth 3 (three) times daily as needed for muscle spasms.    Dispense:  30 tablet    Refill:  0    Order Specific Question:   Supervising Provider    Answer:   Doristine BosworthSTALLINGS, ZOE A [id number]]    Follow-up: No follow-ups on file.   PLAN  Flexeril 5mg  PO tid PRN 30 tabs given for back pain  Referrals to PT and Ortho - pt states she has had injections in the past that have been very helpful. She would prefer to see them for imaging.  Patient encouraged to call  clinic with any questions, comments, or concerns.    Janeece Ageeichard Keeva Reisen, NP

## 2019-04-17 NOTE — Patient Instructions (Signed)
° ° ° °  If you have lab work done today you will be contacted with your lab results within the next 2 weeks.  If you have not heard from us then please contact us. The fastest way to get your results is to register for My Chart. ° ° °IF you received an x-ray today, you will receive an invoice from [facility]. Please contact [facility] at [phone] with questions or concerns regarding your invoice.  ° °IF you received labwork today, you will receive an invoice from [facility]. Please contact [facility] at [phone] with questions or concerns regarding your invoice.  ° °Our billing staff will not be able to assist you with questions regarding bills from these companies. ° °You will be contacted with the lab results as soon as they are available. The fastest way to get your results is to activate your My Chart account. Instructions are located on the last page of this paperwork. If you have not heard from us regarding the results in 2 weeks, please contact this office. °  ° ° ° °

## 2019-04-21 ENCOUNTER — Ambulatory Visit (INDEPENDENT_AMBULATORY_CARE_PROVIDER_SITE_OTHER): Payer: BC Managed Care – PPO | Admitting: Family Medicine

## 2019-04-21 ENCOUNTER — Encounter: Payer: Self-pay | Admitting: Family Medicine

## 2019-04-21 ENCOUNTER — Ambulatory Visit: Payer: Self-pay

## 2019-04-21 DIAGNOSIS — M542 Cervicalgia: Secondary | ICD-10-CM

## 2019-04-21 DIAGNOSIS — M5010 Cervical disc disorder with radiculopathy, unspecified cervical region: Secondary | ICD-10-CM | POA: Diagnosis not present

## 2019-04-21 MED ORDER — GABAPENTIN 300 MG PO CAPS: ORAL_CAPSULE | ORAL | 3 refills | Status: DC

## 2019-04-21 MED ORDER — PREDNISONE 10 MG PO TABS: ORAL_TABLET | ORAL | 0 refills | Status: DC

## 2019-04-21 MED ORDER — TIZANIDINE HCL 2 MG PO TABS: 2.0000 mg | ORAL_TABLET | Freq: Four times a day (QID) | ORAL | 1 refills | Status: DC | PRN

## 2019-04-21 NOTE — Progress Notes (Signed)
I saw and examined the patient with Dr. Mayer Masker and agree with assessment and plan as outlined.  Neck and left arm pain with triceps weakness, concerning for HNP.  Will try PT, meds, ESI.  MRI if not improving.

## 2019-04-21 NOTE — Progress Notes (Signed)
Samantha Parker - 48 y.o. female MRN [id number]  Date of birth: July 18, 1971  Office Visit Note: Visit Date: 04/21/2019 PCP: Leonie Douglas, PA-C Referred by: Maximiano Coss, NP  Subjective: Chief Complaint  Patient presents with  . Middle Back - Pain    Left-sided mid back pain x 2 weeks. Not getting any better. Woke up with the pain. 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   HPI: Samantha Parker is a 48 y.o. female who comes in today with 2.5 weeks of worsening neck/upper back pain.  Reports pain down at cervical neck with shooting pain down left arm into lateral left hand. Numbness in 4th and 5th fingers. No acute injury or preceding event.  Has tried 800 mg ibuprofen didn't help. Tylenol helps more. Was prescribed Flexeril TID by PCP- reports no improvement. Has tried massage, stretching. Keeping up at night. Worse with forward flexion of neck. No notable weakness.   Neck pain in the past. Has had steroid injections in her back in the past - 10-20 years ago. Has had neck pain before.  Narrowing at C6-C7  ROS Otherwise per HPI.  Assessment & Plan: Visit Diagnoses:  1. Neck pain   2. Cervical disc disorder with radiculopathy of cervical region     History and exam consistent with C6-C7 cervical radiculopathy, with weakness of triceps extension, numbness and tingling in ulnar nerve distribution. Narrowing at C6-C7 on plain films. Patient would like epidural steroid injection as she has responded well to steroid injections in other areas of her back in the past. During the interim, will provide steroid dose pack and different muscle relaxant for symptom management. Reported improvement in symptoms with gabapentin- will trial this as well. Encouraged PT to help with likely nerve impingement.  Plan: - medications as below, PT referral - referral to Dr. Ernestina Patches for ESI - if no improvement, consider MRI   Meds & Orders:  Meds ordered this encounter   Medications  . predniSONE (DELTASONE) 10 MG tablet    Sig: Take as directed for 12 days.  Daily dose 6,6,5,5,4,4,3,3,2,2,1,1.    Dispense:  42 tablet    Refill:  0  . tiZANidine (ZANAFLEX) 2 MG tablet    Sig: Take 1-2 tablets (2-4 mg total) by mouth every 6 (six) hours as needed for muscle spasms.    Dispense:  60 tablet    Refill:  1  . gabapentin (NEURONTIN) 300 MG capsule    Sig: 1 PO q HS, may increase to 1 PO TID if needed.    Dispense:  90 capsule    Refill:  3    Orders Placed This Encounter  Procedures  . XR Cervical Spine 2 or 3 views  . Ambulatory referral to Physical Medicine Rehab    Follow-up: No follow-ups on file.   Procedures: No procedures performed  No notes on file   Clinical History: No specialty comments available.   She reports that she is a non-smoker but has been exposed to tobacco smoke. She has never used smokeless tobacco. No results for input(s): HGBA1C, LABURIC in the last 8760 hours.  Objective:  VS:  HT:    WT:   BMI:     BP:   HR: bpm  TEMP: ( )  RESP:  Physical Exam  PHYSICAL EXAM: Gen: NAD, alert, cooperative with exam, well-appearing HEENT: clear conjunctiva,  CV:  no edema, capillary refill brisk, normal rate Resp: non-labored Skin: no rashes, normal  turgor  Neuro: no gross deficits.  Psych:  alert and oriented  Neck/Back: - Inspection: no gross deformity or asymmetry, swelling or ecchymosis - Palpation: TTP in area lateral to C7 spinous process, TTP at left rhomboid and trapezius with pressure point at mid scapula level - ROM: full active ROM of the cervical spine with neck extension, rotation, flexion - pain in all directions - Strength: 5/5 wrist flexion, extension, biceps flexion. 4/5 triceps extension. OK sign, interosseus strength intact  - Neuro: diminished sensation in C8 nerve root distribution b/l, 2+ C5-C7 reflexes - Special testing: positive slump test, positive spurling's  Imaging: X-ray cervical spine with  narrowing at C6-C7  Past Medical/Family/Surgical/Social History: Medications & Allergies reviewed per EMR, new medications updated. Patient Active Problem List   Diagnosis Date Noted  . Diverticulosis of colon 10/07/2017  . Postop check 05/27/2012  . Symptomatic cholelithiasis 04/29/2012   Past Medical History:  Diagnosis Date  . Contact lens/glasses fitting   . Gallstones   . No pertinent past medical history    Family History  Problem Relation Age of Onset  . Breast cancer Mother [id number]  . Diabetes Father   . Hypertension Father   . Hyperlipidemia Father   . Cancer Maternal Grandmother        breast   Past Surgical History:  Procedure Laterality Date  . CHOLECYSTECTOMY  05/08/2012   Procedure: LAPAROSCOPIC CHOLECYSTECTOMY WITH INTRAOPERATIVE CHOLANGIOGRAM;  Surgeon: Cherylynn RidgesJames O Wyatt, MD;  Location: [facility];  Service: General;  Laterality: N/A;  . ENDOMETRIAL ABLATION  2000   Social History   Occupational History  . Not on file  Tobacco Use  . Smoking status: Passive Smoke Exposure - Never Smoker  . Smokeless tobacco: Never Used  Substance and Sexual Activity  . Alcohol use: Yes    Alcohol/week: 3.0 standard drinks    Types: 2 Glasses of wine, 1 Shots of liquor per week    Comment: occ  . Drug use: No  . Sexual activity: Not Currently

## 2019-04-22 ENCOUNTER — Other Ambulatory Visit: Payer: Self-pay | Admitting: Family Medicine

## 2019-04-22 DIAGNOSIS — M542 Cervicalgia: Secondary | ICD-10-CM

## 2019-05-23 ENCOUNTER — Other Ambulatory Visit: Payer: Self-pay

## 2019-05-23 ENCOUNTER — Ambulatory Visit
Admission: RE | Admit: 2019-05-23 | Discharge: 2019-05-23 | Disposition: A | Discharge status: Home / Self Care | Payer: BC Managed Care – PPO | Source: Ambulatory Visit | Attending: Family Medicine | Admitting: Family Medicine

## 2019-05-23 DIAGNOSIS — M542 Cervicalgia: Secondary | ICD-10-CM

## 2019-05-25 ENCOUNTER — Telehealth: Payer: Self-pay | Admitting: Family Medicine

## 2019-05-25 DIAGNOSIS — M542 Cervicalgia: Secondary | ICD-10-CM

## 2019-05-25 MED ORDER — PREGABALIN 75 MG PO CAPS: ORAL_CAPSULE | ORAL | 1 refills | Status: DC

## 2019-05-25 NOTE — Telephone Encounter (Signed)
Lyrica Rx sent.  Stop gabapentin.

## 2019-05-25 NOTE — Addendum Note (Signed)
Addended by: Hortencia Pilar on: 05/25/2019 05:06 PM   Modules accepted: Orders

## 2019-05-25 NOTE — Telephone Encounter (Signed)
IC s/w patient about MRI results. I put order in for cervical ESI.  Neurontin and muscle relaxer and tylenol without relief. She would like to know if there is anything else that she could be prescribed [facility]

## 2019-05-25 NOTE — Telephone Encounter (Signed)
IC patient LMVM and advised per Dr Junius Roads.

## 2019-05-25 NOTE — Telephone Encounter (Signed)
Cervical MRI scan shows the following:  At the C6-7 level, there is moderate narrowing of both the right and the left nerve openings due to a protruding disc and bone spurring.  This is the most likely source of the left arm symptoms.  At the C5-6 level there is another right sided disc protrusion without nerve contact.  Since her symptoms are left-sided, I do not think this is causing any of her troubles.  If symptoms are not improving with physical therapy, then I would suggest either an epidural steroid injection or consultation with a surgeon.

## 2019-05-25 NOTE — Addendum Note (Signed)
Addended byLaurann Montana on: 05/25/2019 04:27 PM   Modules accepted: Orders

## 2019-06-11 ENCOUNTER — Other Ambulatory Visit: Payer: Self-pay

## 2019-06-11 ENCOUNTER — Ambulatory Visit: Payer: Self-pay

## 2019-06-11 ENCOUNTER — Ambulatory Visit (INDEPENDENT_AMBULATORY_CARE_PROVIDER_SITE_OTHER): Payer: BC Managed Care – PPO | Admitting: Physical Medicine and Rehabilitation

## 2019-06-11 ENCOUNTER — Encounter: Payer: Self-pay | Admitting: Physical Medicine and Rehabilitation

## 2019-06-11 VITALS — BP 105/65 | HR 62

## 2019-06-11 DIAGNOSIS — M5412 Radiculopathy, cervical region: Secondary | ICD-10-CM

## 2019-06-11 MED ORDER — METHYLPREDNISOLONE ACETATE 80 MG/ML IJ SUSP
80.0000 mg | Freq: Once | INTRAMUSCULAR | Status: AC
  Administered 2019-06-11: 09:00:00 80 mg

## 2019-06-11 NOTE — Progress Notes (Signed)
.  Numeric Pain Rating Scale and Functional Assessment Average Pain 7   In the last MONTH (on 0-10 scale) has pain interfered with the following?  1. General activity like being  able to carry out your everyday physical activities such as walking, climbing stairs, carrying groceries, or moving a chair?  Rating(7)   +Driver, -BT, -Dye Allergies.   

## 2019-06-16 LAB — HM MAMMOGRAPHY

## 2019-06-29 ENCOUNTER — Telehealth: Payer: Self-pay | Admitting: *Deleted

## 2019-06-29 ENCOUNTER — Other Ambulatory Visit: Payer: Self-pay | Admitting: Physician Assistant

## 2019-06-29 DIAGNOSIS — F4322 Adjustment disorder with anxiety: Secondary | ICD-10-CM

## 2019-06-29 NOTE — Telephone Encounter (Signed)
Requested medication (s) are due for refill today: yes  Requested medication (s) are on the active medication list: yes  Last refill:  04/04/2019  Future visit scheduled: no  Notes to clinic:  Review for refill   Requested Prescriptions  Pending Prescriptions Disp Refills   escitalopram (LEXAPRO) 20 MG tablet [Pharmacy Med Name: ESCITALOPRAM 20 MG TABLET] 90 tablet 1    Sig: TAKE 1 TABLET BY MOUTH EVERY DAY     Psychiatry:  Antidepressants - SSRI Failed - 06/29/2019  1:35 AM      Failed - Completed PHQ-2 or PHQ-9 in the last 360 days.      Passed - Valid encounter within last 6 months    Recent Outpatient Visits          2 months ago Acute left-sided thoracic back pain   [facility] [place], NP   11 months ago Annual physical exam   [facility] [place], [place] D, PA-C   1 year ago Adjustment disorder with depressed mood   [facility], [place] D, PA-C   1 year ago Peritonsillar cellulitis   [facility] [place], [place] D, PA-C   1 year ago Sore throat   [facility], [place] D, [place]

## 2019-06-29 NOTE — Procedures (Signed)
Cervical Epidural Steroid Injection - Interlaminar Approach with Fluoroscopic Guidance  [name]      Date of Birth: 1971-08-06 MRN: [id number] PCP: Leonie Douglas, PA-C      Visit Date: 06/11/2019   Universal Protocol:    Date/Time: 10/05/205:50 AM  Consent Given By: the patient  Position: PRONE  Additional Comments: Vital signs were monitored before and after the procedure. Patient was prepped and draped in the usual sterile fashion. The correct patient, procedure, and site was verified.   Injection Procedure Details:  Procedure Site One Meds Administered:  Meds ordered this encounter  Medications  . methylPREDNISolone acetate (DEPO-MEDROL) injection 80 mg     Laterality: Left  Location/Site: C7-T1  Needle size: 20 G  Needle type: Touhy  Needle Placement: Paramedian epidural space  Findings:  -Comments: Excellent flow of contrast into the epidural space.  Procedure Details: Using a paramedian approach from the side mentioned above, the region overlying the inferior lamina was localized under fluoroscopic visualization and the soft tissues overlying this structure were infiltrated with 4 ml. of 1% Lidocaine without Epinephrine. A # 20 gauge, Tuohy needle was inserted into the epidural space using a paramedian approach.  The epidural space was localized using loss of resistance along with lateral and contralateral oblique bi-planar fluoroscopic views.  After negative aspirate for air, blood, and CSF, a 2 ml. volume of Isovue-250 was injected into the epidural space and the flow of contrast was observed. Radiographs were obtained for documentation purposes.   The injectate was administered into the level noted above.  Additional Comments:  The patient tolerated the procedure well Dressing: 2 x 2 sterile gauze and Band-Aid    Post-procedure details: Patient was observed during the procedure. Post-procedure instructions were reviewed.  Patient  left the clinic in stable condition.

## 2019-06-29 NOTE — Progress Notes (Signed)
Samantha Parker - 48 y.o. female MRN [id number]  Date of birth: Feb 07, 1971  Office Visit Note: Visit Date: 06/11/2019 PCP: Benjiman Core D, PA-C Referred by: Benjiman Core D, [place]*  Subjective: Chief Complaint  Patient presents with  . Neck - Pain  . Left Shoulder - Pain  . Left Arm - Pain, Numbness   HPI:  Samantha Parker is a 48 y.o. female who comes in today At the request of Dr. Casimiro Needle hilts for left C7-T1 interlaminar epidural steroid injection.  She is been having chronic worsening severe neck and left arm pain with numbness since July of this year.  He rates her pain as a 7 out of 10.  This is not been relieved with any conservative care to date including therapy and medications and time.  MRI does show C5-6 and C6-7 actually right lateral recess disc protrusion at C5-6 and C6-7 but there is also a foraminal protrusion on the left at C6-7 with foraminal narrowing on the left.  ROS Otherwise per HPI.  Assessment & Plan: Visit Diagnoses:  1. Cervical radiculopathy     Plan: No additional findings.   Meds & Orders:  Meds ordered this encounter  Medications  . methylPREDNISolone acetate (DEPO-MEDROL) injection 80 mg    Orders Placed This Encounter  Procedures  . XR C-ARM NO REPORT  . Epidural Steroid injection    Follow-up: No follow-ups on file.   Procedures: No procedures performed  Cervical Epidural Steroid Injection - Interlaminar Approach with Fluoroscopic Guidance  [name]      Date of Birth: [birth year] MRN: [id number] PCP: Magdalene River, PA-C      Visit Date: 06/11/2019   Universal Protocol:    Date/Time: 10/05/205:50 AM  Consent Given By: the patient  Position: PRONE  Additional Comments: Vital signs were monitored before and after the procedure. Patient was prepped and draped in the usual sterile fashion. The correct patient, procedure, and site was verified.   Injection Procedure Details:  Procedure Site One Meds  Administered:  Meds ordered this encounter  Medications  . methylPREDNISolone acetate (DEPO-MEDROL) injection 80 mg     Laterality: Left  Location/Site: C7-T1  Needle size: 20 G  Needle type: Touhy  Needle Placement: Paramedian epidural space  Findings:  -Comments: Excellent flow of contrast into the epidural space.  Procedure Details: Using a paramedian approach from the side mentioned above, the region overlying the inferior lamina was localized under fluoroscopic visualization and the soft tissues overlying this structure were infiltrated with 4 ml. of 1% Lidocaine without Epinephrine. A # 20 gauge, Tuohy needle was inserted into the epidural space using a paramedian approach.  The epidural space was localized using loss of resistance along with lateral and contralateral oblique bi-planar fluoroscopic views.  After negative aspirate for air, blood, and CSF, a 2 ml. volume of Isovue-250 was injected into the epidural space and the flow of contrast was observed. Radiographs were obtained for documentation purposes.   The injectate was administered into the level noted above.  Additional Comments:  The patient tolerated the procedure well Dressing: 2 x 2 sterile gauze and Band-Aid    Post-procedure details: Patient was observed during the procedure. Post-procedure instructions were reviewed.  Patient left the clinic in stable condition.    Clinical History: MRI CERVICAL SPINE WITHOUT CONTRAST  TECHNIQUE: Multiplanar, multisequence MR imaging of the cervical spine was performed. No intravenous contrast was administered.  COMPARISON:  Radiographs from 04/21/2019  FINDINGS: Alignment: No  vertebral subluxation is observed.  Vertebrae: Mild type 1 degenerative endplate findings at L3-8. Mild disc desiccation at all cervical levels.  Cord: At the C6 level the central canal focally approaches up to 1 mm in diameter. This is probably incidental and does not quite  qualify as a syrinx.  Posterior Fossa, vertebral arteries, paraspinal tissues: Upper normal sized bilateral level II lymph nodes observed in the neck, including single 9 mm lymph nodes on each side on image 8/8.  Disc levels:  C2-3: Unremarkable.  C3-4: Unremarkable.  C4-5: Unremarkable.  C5-6: Mild right foraminal stenosis due to a right lateral recess and foraminal disc protrusion along with uncinate spurring.  C6-7: Moderate bilateral foraminal stenosis and borderline central narrowing of the thecal sac due to disc bulge, right lateral recess disc protrusion, left foraminal disc protrusion, and uncinate spurring.  C7-T1: Unremarkable.  IMPRESSION: 1. Cervical spondylosis and degenerative disc disease causing moderate impingement at C6-7 and mild impingement at C5-6 is noted above. 2. Upper normal sized level II from pleural 2 lymph nodes in the neck. Not overtly pathologically enlarged. 3. Subtle prominence of the central canal within the cord at the C6 level. This is at or less than 1 mm in diameter and hence is probably better viewed as a normal variant rather than a small slit like syrinx.   Electronically Signed   By: Van Clines M.D.   On: 05/23/2019 10:35     Objective:  VS:  HT:    WT:   BMI:     BP:105/65  HR:62bpm  TEMP: ( )  RESP:  Physical Exam  Ortho Exam Imaging: No results found.

## 2019-06-29 NOTE — Telephone Encounter (Signed)
Please schedule for transfer of care. A 30 day prescription was sent in for Lexapro .  She will need an appointment she did she [facility] Mustard but it was nothing related to this medication.    Thank You

## 2019-07-01 NOTE — Telephone Encounter (Signed)
Spoke with pt and scheduled appt °

## 2019-07-21 ENCOUNTER — Encounter: Payer: Self-pay | Admitting: Adult Health Nurse Practitioner

## 2019-07-21 ENCOUNTER — Ambulatory Visit (INDEPENDENT_AMBULATORY_CARE_PROVIDER_SITE_OTHER): Payer: Self-pay | Admitting: Adult Health Nurse Practitioner

## 2019-07-21 ENCOUNTER — Other Ambulatory Visit: Payer: Self-pay

## 2019-07-21 VITALS — BP 116/78 | Ht 71.0 in | Wt 181.0 lb

## 2019-07-21 DIAGNOSIS — F4322 Adjustment disorder with anxiety: Secondary | ICD-10-CM

## 2019-07-21 DIAGNOSIS — R6889 Other general symptoms and signs: Secondary | ICD-10-CM

## 2019-07-21 DIAGNOSIS — R41840 Attention and concentration deficit: Secondary | ICD-10-CM

## 2019-07-21 DIAGNOSIS — F418 Other specified anxiety disorders: Secondary | ICD-10-CM

## 2019-07-21 HISTORY — DX: Other general symptoms and signs: R68.89

## 2019-07-21 HISTORY — DX: Attention and concentration deficit: R41.840

## 2019-07-21 HISTORY — DX: Other specified anxiety disorders: F41.8

## 2019-07-21 MED ORDER — ESCITALOPRAM OXALATE 20 MG PO TABS: 20.0000 mg | ORAL_TABLET | Freq: Every day | ORAL | 6 refills | Status: DC

## 2019-07-21 MED ORDER — AMPHETAMINE-DEXTROAMPHETAMINE 10 MG PO TABS: 10.0000 mg | ORAL_TABLET | Freq: Two times a day (BID) | ORAL | 0 refills | Status: DC
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
## 2019-07-22 LAB — CMP14+EGFR
ALT: 27 IU/L (ref 0–32)
AST: 23 IU/L (ref 0–40)
Albumin/Globulin Ratio: 1.6 (ref 1.2–2.2)
Albumin: 4.2 g/dL (ref 3.8–4.8)
Alkaline Phosphatase: 78 IU/L (ref 39–117)
BUN/Creatinine Ratio: 17 (ref 9–23)
BUN: 10 mg/dL (ref 6–24)
Bilirubin Total: 0.2 mg/dL (ref 0.0–1.2)
CO2: 23 mmol/L (ref 20–29)
Calcium: 9.1 mg/dL (ref 8.7–10.2)
Chloride: 103 mmol/L (ref 96–106)
Creatinine, Ser: 0.6 mg/dL (ref 0.57–1.00)
GFR calc Af Amer: 123 mL/min/{1.73_m2} (ref >59)
GFR calc non Af Amer: 107 mL/min/{1.73_m2} (ref >59)
Globulin, Total: 2.6 g/dL (ref 1.5–4.5)
Glucose: 76 mg/dL (ref 65–99)
Potassium: 4.1 mmol/L (ref 3.5–5.2)
Sodium: 139 mmol/L (ref 134–144)
Total Protein: 6.8 g/dL (ref 6.0–8.5)

## 2019-07-22 LAB — CBC WITH DIFFERENTIAL/PLATELET
Basophils Absolute: 0.1 10*3/uL (ref 0.0–0.2)
Basos: 1 %
EOS (ABSOLUTE): 0.1 10*3/uL (ref 0.0–0.4)
Eos: 1 %
Hematocrit: 40.8 % (ref 34.0–46.6)
Hemoglobin: 13.8 g/dL (ref 11.1–15.9)
Immature Grans (Abs): 0 10*3/uL (ref 0.0–0.1)
Immature Granulocytes: 1 %
Lymphocytes Absolute: 1.5 10*3/uL (ref 0.7–3.1)
Lymphs: 23 %
MCH: 31.1 pg (ref 26.6–33.0)
MCHC: 33.8 g/dL (ref 31.5–35.7)
MCV: 92 fL (ref 79–97)
Monocytes Absolute: 0.6 10*3/uL (ref 0.1–0.9)
Monocytes: 9 %
Neutrophils Absolute: 4.2 10*3/uL (ref 1.4–7.0)
Neutrophils: 65 %
Platelets: 352 10*3/uL (ref 150–450)
RBC: 4.44 x10E6/uL (ref 3.77–5.28)
RDW: 12.3 % (ref 11.7–15.4)
WBC: 6.4 10*3/uL (ref 3.4–10.8)

## 2019-07-22 LAB — VITAMIN B12: Vitamin B-12: 380 pg/mL (ref 232–1245)

## 2019-07-22 LAB — TSH: TSH: 3.28 u[IU]/mL (ref 0.450–4.500)

## 2019-08-21 ENCOUNTER — Ambulatory Visit (INDEPENDENT_AMBULATORY_CARE_PROVIDER_SITE_OTHER): Payer: Self-pay | Admitting: Adult Health Nurse Practitioner

## 2019-08-21 ENCOUNTER — Other Ambulatory Visit: Payer: Self-pay

## 2019-08-21 ENCOUNTER — Encounter: Payer: Self-pay | Admitting: Adult Health Nurse Practitioner

## 2019-08-21 VITALS — BP 104/71 | HR 82 | Temp 99.1°F | Ht 71.0 in

## 2019-08-21 DIAGNOSIS — R41840 Attention and concentration deficit: Secondary | ICD-10-CM

## 2019-08-21 DIAGNOSIS — R6889 Other general symptoms and signs: Secondary | ICD-10-CM

## 2019-08-21 DIAGNOSIS — F418 Other specified anxiety disorders: Secondary | ICD-10-CM

## 2019-08-21 MED ORDER — AMPHETAMINE-DEXTROAMPHETAMINE 10 MG PO TABS: 10.0000 mg | ORAL_TABLET | Freq: Two times a day (BID) | ORAL | 0 refills | Status: DC

## 2019-08-21 NOTE — Patient Instructions (Signed)
° ° ° °  If you have lab work done today you will be contacted with your lab results within the next 2 weeks.  If you have not heard from us then please contact us. The fastest way to get your results is to register for My Chart. ° ° °IF you received an x-ray today, you will receive an invoice from [facility]. Please contact [facility] at [phone] with questions or concerns regarding your invoice.  ° °IF you received labwork today, you will receive an invoice from [facility]. Please contact [facility] at [phone] with questions or concerns regarding your invoice.  ° °Our billing staff will not be able to assist you with questions regarding bills from these companies. ° °You will be contacted with the lab results as soon as they are available. The fastest way to get your results is to activate your My Chart account. Instructions are located on the last page of this paperwork. If you have not heard from us regarding the results in 2 weeks, please contact this office. °  ° ° ° °
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
## 2019-09-14 ENCOUNTER — Other Ambulatory Visit: Payer: Self-pay | Admitting: Family Medicine

## 2019-09-21 ENCOUNTER — Telehealth: Payer: Self-pay | Admitting: Adult Health Nurse Practitioner

## 2019-09-21 NOTE — Telephone Encounter (Signed)
Please advise on med refill. Pt stated that she was to get a med refill for a couple of months, but she has only received one refill. Pt is now out of medication.

## 2019-09-21 NOTE — Telephone Encounter (Signed)
Pt was told she would receive 2 months supply of her adderall prescription and only received one month. Pt leaves town Wednesday morning and would like to have it refilled before then . Please advise.

## 2019-09-22 ENCOUNTER — Other Ambulatory Visit: Payer: Self-pay | Admitting: Adult Health Nurse Practitioner

## 2019-09-22 ENCOUNTER — Other Ambulatory Visit: Payer: Self-pay | Admitting: Family Medicine

## 2019-09-22 MED ORDER — AMPHETAMINE-DEXTROAMPHETAMINE 10 MG PO TABS: 10.0000 mg | ORAL_TABLET | Freq: Two times a day (BID) | ORAL | 0 refills | Status: DC

## 2019-09-22 MED ORDER — TIZANIDINE HCL 2 MG PO TABS: 2.0000 mg | ORAL_TABLET | Freq: Four times a day (QID) | ORAL | 1 refills | Status: DC | PRN

## 2019-09-22 NOTE — Telephone Encounter (Signed)
Refilled med.  Earliest she could have refilled was today anyway.  She is still supposed to have some neurocognitive testing that I ordered.  Would need her to get that or will have to refer her to ADD specialist to further evaluate.  Please let patient know.   Thank you.  Judson Roch

## 2019-09-23 NOTE — Telephone Encounter (Signed)
Called pt and relayed message to pt and she stated understanding. Pt stated that she did not get a call to get that test done as of yet. I stated understanding and will forward to referrals.

## 2019-10-16 ENCOUNTER — Ambulatory Visit: Payer: Self-pay | Attending: Internal Medicine

## 2019-10-16 DIAGNOSIS — Z23 Encounter for immunization: Secondary | ICD-10-CM | POA: Insufficient documentation

## 2019-10-16 NOTE — Progress Notes (Signed)
   Covid-19 Vaccination Clinic  Name:  GLORI MACHNIK    MRN: [id number] DOB: August 05, 1971  10/16/2019  Ms. Rickles was observed post Covid-19 immunization for 15 minutes without incidence. She was provided with Vaccine Information Sheet and instruction to access the V-Safe system.   Ms. Pifer was instructed to call 911 with any severe reactions post vaccine: Marland Kitchen Difficulty breathing  . Swelling of your face and throat  . A fast heartbeat  . A bad rash all over your body  . Dizziness and weakness    Immunizations Administered    Name Date Dose VIS Date Route   Pfizer COVID-19 Vaccine 10/16/2019  3:04 PM 0.3 mL 09/04/2019 Intramuscular   Manufacturer: [employer], [employer]   Lot: [id number]   NDC: [address]-7

## 2019-10-23 ENCOUNTER — Telehealth: Payer: Self-pay | Admitting: Adult Health Nurse Practitioner

## 2019-10-23 NOTE — Telephone Encounter (Signed)
What is the name of the medication? Adderall   Have you contacted your pharmacy to request a refill? no  [facility]   Patient requested med refill be fulfilled by end of day because she is due for it at this time.

## 2019-10-26 NOTE — Telephone Encounter (Signed)
Would like a refill on Adderall

## 2019-10-27 ENCOUNTER — Other Ambulatory Visit: Payer: Self-pay | Admitting: Adult Health Nurse Practitioner

## 2019-10-27 MED ORDER — AMPHETAMINE-DEXTROAMPHETAMINE 10 MG PO TABS: 10.0000 mg | ORAL_TABLET | Freq: Two times a day (BID) | ORAL | 0 refills | Status: DC

## 2019-11-06 ENCOUNTER — Encounter: Payer: Self-pay | Admitting: Adult Health Nurse Practitioner

## 2019-11-06 ENCOUNTER — Ambulatory Visit: Payer: Self-pay | Attending: Internal Medicine

## 2019-11-06 ENCOUNTER — Ambulatory Visit (INDEPENDENT_AMBULATORY_CARE_PROVIDER_SITE_OTHER): Payer: Self-pay | Admitting: Adult Health Nurse Practitioner

## 2019-11-06 ENCOUNTER — Other Ambulatory Visit: Payer: Self-pay

## 2019-11-06 VITALS — BP 107/74 | HR 76 | Temp 98.0°F | Ht 72.0 in | Wt 188.0 lb

## 2019-11-06 DIAGNOSIS — Z23 Encounter for immunization: Secondary | ICD-10-CM | POA: Insufficient documentation

## 2019-11-06 DIAGNOSIS — R41840 Attention and concentration deficit: Secondary | ICD-10-CM

## 2019-11-06 DIAGNOSIS — F902 Attention-deficit hyperactivity disorder, combined type: Secondary | ICD-10-CM

## 2019-11-06 MED ORDER — AMPHETAMINE-DEXTROAMPHETAMINE 20 MG PO TABS: 20.0000 mg | ORAL_TABLET | Freq: Two times a day (BID) | ORAL | 0 refills | Status: DC

## 2019-11-06 NOTE — Patient Instructions (Signed)
° ° ° °  If you have lab work done today you will be contacted with your lab results within the next 2 weeks.  If you have not heard from us then please contact us. The fastest way to get your results is to register for My Chart. ° ° °IF you received an x-ray today, you will receive an invoice from [facility]. Please contact [facility] at [phone] with questions or concerns regarding your invoice.  ° °IF you received labwork today, you will receive an invoice from [facility]. Please contact [facility] at [phone] with questions or concerns regarding your invoice.  ° °Our billing staff will not be able to assist you with questions regarding bills from these companies. ° °You will be contacted with the lab results as soon as they are available. The fastest way to get your results is to activate your My Chart account. Instructions are located on the last page of this paperwork. If you have not heard from us regarding the results in 2 weeks, please contact this office. °  ° ° ° °

## 2019-11-06 NOTE — Progress Notes (Signed)
History   Chief Complaint  Patient presents with  . Follow-up    anxiety/depression  . score 9    HPI  Patient presents for a f/u of ADD/anxiety and depression.  She is doing well.  Feels like she is developing a little tolerance to the Adderall.  She is questioning whether she should go up to 20mg .  We discussed long acting which she is not ready to try yet.  She has taken up to 30mg  daily and is out of her current med.   Past Medical History:  Diagnosis Date  . Attention and concentration deficit 07/21/2019  . Contact lens/glasses fitting   . Depression   . Forgetfulness 07/21/2019  . Gallstones   . 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 No pertinent past medical history    Past Surgical History:  Procedure Laterality Date  . CHOLECYSTECTOMY  05/08/2012   Procedure: LAPAROSCOPIC CHOLECYSTECTOMY WITH INTRAOPERATIVE CHOLANGIOGRAM;  Surgeon: 07/23/2019, MD;  Location: [facility];  Service: General;  Laterality: N/A;  . ENDOMETRIAL ABLATION  2000   Family History  Problem Relation Age of Onset  . Breast cancer Mother 39  . Diabetes Father   . Hypertension Father   . Hyperlipidemia Father   . Cancer Maternal Grandmother        breast   Social History   Tobacco Use  . Smoking status: Passive Smoke Exposure - Never Smoker  . Smokeless tobacco: Never Used  Substance Use Topics  . Alcohol use: Yes    Alcohol/week: 3.0 standard drinks    Types: 2 Glasses of wine, 1 Shots of liquor per week    Comment: occ  . Drug use: No   OB History   No obstetric history on file.    Review of Systems  Constitutional: Negative for diaphoresis, malaise/fatigue and weight loss.  Cardiovascular: Negative for chest pain and leg swelling.  Psychiatric/Behavioral: The patient is nervous/anxious.      Review of Systems  Constitutional: Negative for diaphoresis, malaise/fatigue and weight loss.  Cardiovascular: Negative for chest pain and leg swelling.   Psychiatric/Behavioral: The patient is nervous/anxious.      Allergies   Penicillins  Home Medications    Current Outpatient Medications:  .  amphetamine-dextroamphetamine (ADDERALL) 10 MG tablet, Take 1 tablet (10 mg total) by mouth 2 (two) times daily., Disp: 60 tablet, Rfl: 0 .  escitalopram (LEXAPRO) 20 MG tablet, Take 1 tablet (20 mg total) by mouth daily., Disp: 30 tablet, Rfl: 6 .  tiZANidine (ZANAFLEX) 2 MG tablet, Take 1 tablet (2 mg total) by mouth every 6 (six) hours as needed for muscle spasms., Disp: 360 tablet, Rfl: 1  Meds Ordered and Administered this Visit  No orders of the defined types were placed in this encounter.   BP 107/74 (BP Location: Right Arm, Patient Position: Sitting, Cuff Size: Normal)   Pulse 76   Temp 98 F (36.7 C) (Temporal)   Ht 6' (1.829 m)   Wt 188 lb (85.3 kg)   LMP 09/25/2019   SpO2 98%   BMI 25.50 kg/m   Physical Exam General appearance: alert, well appearing, and in no distress, oriented to person, place, and time and anxious. Chest: clear to auscultation, no wheezes, rales or rhonchi, symmetric air entry.  CVS exam: normal rate, regular rhythm, normal S1, S2, no murmurs, rubs, clicks or gallops. Skin exam - normal coloration and turgor, no rashes, no suspicious skin lesions noted. Mental Status: normal mood, behavior, speech,  dress, motor activity, and thought processes, anxious.   MDM   1. Attention and concentration deficit   2. Attention deficit hyperactivity disorder (ADHD), combined type    Meds ordered this encounter  Medications  . amphetamine-dextroamphetamine (ADDERALL) 20 MG tablet    Sig: Take 1 tablet (20 mg total) by mouth 2 (two) times daily.    Dispense:  60 tablet    Refill:  0  . amphetamine-dextroamphetamine (ADDERALL) 20 MG tablet    Sig: Take 1 tablet (20 mg total) by mouth 2 (two) times daily.    Dispense:  60 tablet    Refill:  0  . amphetamine-dextroamphetamine (ADDERALL) 20 MG tablet    Sig:  Take 1 tablet (20 mg total) by mouth 2 (two) times daily.    Dispense:  60 tablet    Refill:  0    Glyn Ade, NP

## 2019-11-06 NOTE — Progress Notes (Signed)
   Covid-19 Vaccination Clinic  Name:  Samantha Parker    MRN: [id number] DOB: 1971/08/15  11/06/2019  Ms. Samantha Parker was observed post Covid-19 immunization for 15 minutes without incidence. She was provided with Vaccine Information Sheet and instruction to access the V-Safe system.   Samantha Parker was instructed to call 911 with any severe reactions post vaccine: Marland Kitchen Difficulty breathing  . Swelling of your face and throat  . A fast heartbeat  . A bad rash all over your body  . Dizziness and weakness    Immunizations Administered    Name Date Dose VIS Date Route   Pfizer COVID-19 Vaccine 11/06/2019  2:29 PM 0.3 mL 09/04/2019 Intramuscular   Manufacturer: [employer], [employer]   Lot: [id number]   NDC: [address]-5

## 2019-11-13 ENCOUNTER — Ambulatory Visit: Payer: Self-pay | Admitting: Adult Health Nurse Practitioner

## 2019-12-04 ENCOUNTER — Telehealth: Payer: Self-pay

## 2019-12-04 NOTE — Telephone Encounter (Signed)
Pt medication has been filled,  No action needed

## 2019-12-04 NOTE — Telephone Encounter (Signed)
Pt medication has been sent to the pharmacy, no action needed per crm

## 2020-02-11 ENCOUNTER — Telehealth: Payer: Self-pay | Admitting: Adult Health Nurse Practitioner

## 2020-02-11 NOTE — Telephone Encounter (Signed)
amphetamine-dextroamphetamine (ADDERALL) 20 MG tablet  Pt states going to make an appt with in nxt month for nxt refill, if not refilled pls contact at [phone] lst visit 11/06/19  [id number] [place] [place] [facility] [address]:  [phone] Fax:  [phone]

## 2020-02-11 NOTE — Telephone Encounter (Signed)
Please schedule this patient for an TOC with another provider byrd is no longer here and patient need to be seen for refill.

## 2020-02-11 NOTE — Telephone Encounter (Signed)
This patient is requesting a refill on Adderall med. This is a Samantha Parker patient I sent a msg to scheduling pool to get this patient scheduled for a TOC visit with one of our other providers. Please advise if we need to refill this med until patient appt with new provider or just refuse this med.

## 2020-02-11 NOTE — Telephone Encounter (Signed)
Requested medication (s) are due for refill today: yes  Requested medication (s) are on the active medication list: yes  Future visit scheduled: no  Notes to clinic:  this refill cannot be delegated Patient will make appointment  Please contact if not able to refill   Requested Prescriptions  Pending Prescriptions Disp Refills   amphetamine-dextroamphetamine (ADDERALL) 20 MG tablet 60 tablet 0    Sig: Take 1 tablet (20 mg total) by mouth 2 (two) times daily.      Not Delegated - Psychiatry:  Stimulants/ADHD Failed - 02/11/2020  9:35 AM      Failed - This refill cannot be delegated      Failed - Urine Drug Screen completed in last 360 days.      Failed - Valid encounter within last 3 months    Recent Outpatient Visits           3 months ago Attention and concentration deficit   Primary Care at [facility], Lonna Cobb, NP   5 months ago Forgetfulness   Primary Care at [facility], Lonna Cobb, NP   6 months ago Cognitive attention deficit   Primary Care at [facility], Lonna Cobb, NP   10 months ago Acute left-sided thoracic back pain   [facility] at [facility], Gerlene Burdock, NP   1 year ago Annual physical exam   Primary Care at [facility], [place] D, PA-C

## 2020-02-12 MED ORDER — AMPHETAMINE-DEXTROAMPHETAMINE 20 MG PO TABS: 20.0000 mg | ORAL_TABLET | Freq: Two times a day (BID) | ORAL | 0 refills | Status: DC

## 2020-02-12 NOTE — Addendum Note (Signed)
Addended by: Myles Lipps on: 02/12/2020 01:06 PM   Modules accepted: Orders

## 2020-02-15 NOTE — Telephone Encounter (Signed)
02/15/2020 - PATIENT HAS A TOC SCHEDULED FOR TUES. 05/10/2020 WITH DR. Darcel Bayley. MBC

## 2020-03-11 ENCOUNTER — Other Ambulatory Visit: Payer: Self-pay | Admitting: Family Medicine

## 2020-03-11 ENCOUNTER — Telehealth: Payer: Self-pay

## 2020-03-11 DIAGNOSIS — F4322 Adjustment disorder with anxiety: Secondary | ICD-10-CM

## 2020-03-11 MED ORDER — AMPHETAMINE-DEXTROAMPHETAMINE 20 MG PO TABS: 20.0000 mg | ORAL_TABLET | Freq: Two times a day (BID) | ORAL | 0 refills | Status: DC

## 2020-03-11 MED ORDER — ESCITALOPRAM OXALATE 20 MG PO TABS: 20.0000 mg | ORAL_TABLET | Freq: Every day | ORAL | 6 refills | Status: DC

## 2020-03-11 NOTE — Telephone Encounter (Signed)
amphetamine-dextroamphetamine (ADDERALL) 20 MG tablet  escitalopram (LEXAPRO) 20 MG tablet     Patient is requesting refill.    [facility]  [id number] [place], [place] - [address] AT [facility] [address] [facility] [address] Phone:  [phone]  Fax:  [phone]

## 2020-03-11 NOTE — Telephone Encounter (Signed)
pmp reviewd, appropriate meds refilled 

## 2020-03-11 NOTE — Telephone Encounter (Signed)
Requested medication (s) are due for refill today -yes  Requested medication (s) are on the active medication list -yes  Future visit scheduled -yes  Last refill: 02/12/20  Notes to clinic: Request for non delegated Rx  Requested Prescriptions  Pending Prescriptions Disp Refills   amphetamine-dextroamphetamine (ADDERALL) 20 MG tablet 60 tablet 0    Sig: Take 1 tablet (20 mg total) by mouth 2 (two) times daily.      Not Delegated - Psychiatry:  Stimulants/ADHD Failed - 03/11/2020  1:20 PM      Failed - This refill cannot be delegated      Failed - Urine Drug Screen completed in last 360 days.      Failed - Valid encounter within last 3 months    Recent Outpatient Visits           4 months ago Attention and concentration deficit   Primary Care at [facility], Lonna Cobb, NP   6 months ago Forgetfulness   Primary Care at [facility], Lonna Cobb, NP   7 months ago Cognitive attention deficit   Primary Care at [facility], Lonna Cobb, NP   10 months ago Acute left-sided thoracic back pain   [facility] Care at [facility], Gerlene Burdock, NP   1 year ago Annual physical exam   Primary Care at [place], [place] D, PA-C       Future Appointments             In 2 months Myles Lipps, MD Primary Care at [place], [facility]                Requested Prescriptions  Pending Prescriptions Disp Refills   amphetamine-dextroamphetamine (ADDERALL) 20 MG tablet 60 tablet 0    Sig: Take 1 tablet (20 mg total) by mouth 2 (two) times daily.      Not Delegated - Psychiatry:  Stimulants/ADHD Failed - 03/11/2020  1:20 PM      Failed - This refill cannot be delegated      Failed - Urine Drug Screen completed in last 360 days.      Failed - Valid encounter within last 3 months    Recent Outpatient Visits           4 months ago Attention and concentration deficit   Primary Care at [facility], Lonna Cobb, NP   6 months ago Forgetfulness   Primary Care at [facility], Lonna Cobb, NP   7 months ago Cognitive attention deficit   Primary Care at [facility], Lonna Cobb, NP   10 months ago Acute left-sided thoracic back pain   [facility] at [facility], Gerlene Burdock, NP   1 year ago Annual physical exam   Primary Care at [facility], Gerald Stabs, PA-C       Future Appointments             In 2 months Leretha Pol, Meda Coffee, MD Primary Care at [place], [facility]

## 2020-03-11 NOTE — Telephone Encounter (Signed)
Patient is requesting a refill of the following medications: Requested Prescriptions   Pending Prescriptions Disp Refills  . amphetamine-dextroamphetamine (ADDERALL) 20 MG tablet 60 tablet 0    Sig: Take 1 tablet (20 mg total) by mouth 2 (two) times daily.    Date of patient request: 03/11/20 Last office visit: 11/06/19 w/ byrd  Date of last refill: 02/12/20 Last refill amount: 60 Follow up time period per chart: 05/10/20

## 2020-04-12 ENCOUNTER — Other Ambulatory Visit: Payer: Self-pay | Admitting: Adult Health Nurse Practitioner

## 2020-04-12 MED ORDER — AMPHETAMINE-DEXTROAMPHETAMINE 20 MG PO TABS: 20.0000 mg | ORAL_TABLET | Freq: Two times a day (BID) | ORAL | 0 refills | Status: DC

## 2020-04-12 NOTE — Telephone Encounter (Signed)
Please review for refill. Refill not delegated per protocol.  Last refill: 03/11/20 #60

## 2020-04-12 NOTE — Telephone Encounter (Signed)
pmp reviewd, appropriate meds refilled 

## 2020-04-12 NOTE — Telephone Encounter (Signed)
Patient is requesting a refill of the following medications: Requested Prescriptions   Pending Prescriptions Disp Refills  . amphetamine-dextroamphetamine (ADDERALL) 20 MG tablet 60 tablet 0    Sig: Take 1 tablet (20 mg total) by mouth 2 (two) times daily.    Date of patient request: 04/12/2020 Last office visit: 11/06/2019 Date of last refill: 01/05/2020 Last refill amount: 60 tablets Follow up time period per chart: 05/10/2020

## 2020-04-12 NOTE — Telephone Encounter (Signed)
Medication Refill - Medication: amphetamine-dextroamphetamine (ADDERALL) 20 MG tablet    Has the patient contacted their pharmacy?yes (Agent: If no, request that the patient contact the pharmacy for the refill.) (Agent: If yes, when and what did the pharmacy advise?)Contact PCP  Preferred Pharmacy (with phone number or street name):  [id number] [place], [place] - [address] AT [address] [facility] [address] Phone:  [phone]  Fax:  [phone]       Agent: Please be advised that RX refills may take up to 3 business days. We ask that you follow-up with your pharmacy.

## 2020-05-06 ENCOUNTER — Other Ambulatory Visit: Payer: Self-pay | Admitting: Family Medicine

## 2020-05-06 DIAGNOSIS — F4322 Adjustment disorder with anxiety: Secondary | ICD-10-CM

## 2020-05-06 NOTE — Telephone Encounter (Signed)
Requested medication (s) are due for refill today: no  Requested medication (s) are on the active medication list: yes  Last refill: no  Future visit scheduled: yes  Notes to clinic:  Patient requesting 90 day with 3 refills    Requested Prescriptions  Pending Prescriptions Disp Refills   escitalopram (LEXAPRO) 20 MG tablet [Pharmacy Med Name: ESCITALOPRAM 20 MG TABLET] 90 tablet 3    Sig: TAKE 1 TABLET BY MOUTH EVERY DAY      Psychiatry:  Antidepressants - SSRI Failed - 05/06/2020 11:30 AM      Failed - Valid encounter within last 6 months    Recent Outpatient Visits           6 months ago Attention and concentration deficit   Primary Care at [facility], Lonna Cobb, NP   8 months ago Forgetfulness   Primary Care at [facility], Lonna Cobb, NP   9 months ago Cognitive attention deficit   Primary Care at [facility], Lonna Cobb, NP   1 year ago Acute left-sided thoracic back pain   Primary Care at [facility], Gerlene Burdock, NP   1 year ago Annual physical exam   Primary Care at [facility], Gerald Stabs, PA-C       Future Appointments             In 4 days Myles Lipps, MD Primary Care at [place], [facility]            Passed - Completed PHQ-2 or PHQ-9 in the last 360 days.

## 2020-05-10 ENCOUNTER — Encounter: Payer: Self-pay | Admitting: Family Medicine

## 2020-05-10 ENCOUNTER — Ambulatory Visit (INDEPENDENT_AMBULATORY_CARE_PROVIDER_SITE_OTHER): Payer: Self-pay | Admitting: Family Medicine

## 2020-05-10 ENCOUNTER — Other Ambulatory Visit: Payer: Self-pay

## 2020-05-10 VITALS — BP 108/73 | HR 75 | Temp 98.0°F | Ht 72.0 in | Wt 189.8 lb

## 2020-05-10 DIAGNOSIS — F418 Other specified anxiety disorders: Secondary | ICD-10-CM

## 2020-05-10 DIAGNOSIS — Z86010 Personal history of colonic polyps: Secondary | ICD-10-CM | POA: Insufficient documentation

## 2020-05-10 DIAGNOSIS — Z79899 Other long term (current) drug therapy: Secondary | ICD-10-CM

## 2020-05-10 DIAGNOSIS — F902 Attention-deficit hyperactivity disorder, combined type: Secondary | ICD-10-CM

## 2020-05-10 MED ORDER — AMPHETAMINE-DEXTROAMPHETAMINE 20 MG PO TABS: 20.0000 mg | ORAL_TABLET | Freq: Two times a day (BID) | ORAL | 0 refills | Status: DC

## 2020-05-10 NOTE — Patient Instructions (Signed)
° ° ° °  If you have lab work done today you will be contacted with your lab results within the next 2 weeks.  If you have not heard from us then please contact us. The fastest way to get your results is to register for My Chart. ° ° °IF you received an x-ray today, you will receive an invoice from [facility]. Please contact [facility] at [phone] with questions or concerns regarding your invoice.  ° °IF you received labwork today, you will receive an invoice from [facility]. Please contact [facility] at [phone] with questions or concerns regarding your invoice.  ° °Our billing staff will not be able to assist you with questions regarding bills from these companies. ° °You will be contacted with the lab results as soon as they are available. The fastest way to get your results is to activate your My Chart account. Instructions are located on the last page of this paperwork. If you have not heard from us regarding the results in 2 weeks, please contact this office. °  ° ° ° °

## 2020-05-10 NOTE — Progress Notes (Signed)
8/17/20219:53 AM  Samantha Parker 11/04/1970, 49 y.o., female [id number]  Chief Complaint  Patient presents with  . ADD  . Depression    phq9 complete taking lexapro for a while now, Given to her yrs ago to cope eith bad break up. Feels the adderral and and the lexapro works well together. In therapy goes month  . Anxiety    HPI:   Patient is a 49 y.o. female with past medical history significant for ADD, depression and anxiety, colonic polyps who presents today for med refill  Dx ADD about a year ago after being in therapy for long time Has been on adderall 20mg  BID, doing well Able to focus and concentrate more Denies any side effects, able to sleep, normal appetite Her mood is very stable, lexapro started after difficult breakup She is wondering about weaning off lexapro pmp reviewed  Last CPE oct 2019, due for pap and mammo Colonoscopy due 2023  Depression screen [facility] 2/9 05/10/2020 11/06/2019 08/21/2019  Decreased Interest 0 1 0  Down, Depressed, Hopeless 0 1 0  PHQ - 2 Score 0 2 0  Altered sleeping - 1 -  Tired, decreased energy - 1 -  Change in appetite - 1 -  Feeling bad or failure about yourself  - 0 -  Trouble concentrating - 1 -  Moving slowly or fidgety/restless - 0 -  Suicidal thoughts - 0 -  PHQ-9 Score - 6 -  Difficult doing work/chores - Somewhat difficult -    Fall Risk  05/10/2020 11/06/2019 08/21/2019 07/21/2019 04/17/2019  Falls in the past year? 0 0 0 1 0  Number falls in past yr: 0 0 0 1 0  Injury with Fall? 0 0 0 0 0  Risk for fall due to : - - - History of fall(s) -  Follow up - Falls evaluation completed - Falls evaluation completed -     Allergies  Allergen Reactions  . Penicillins Nausea Only and Rash    Rash will spread all over the body.    Prior to Admission medications   Medication Sig Start Date End Date Taking? Authorizing Provider  amphetamine-dextroamphetamine (ADDERALL) 20 MG tablet Take 1 tablet (20 mg total) by mouth 2  (two) times daily. 04/12/20  Yes 04/14/20, MD  escitalopram (LEXAPRO) 20 MG tablet Take 1 tablet (20 mg total) by mouth daily. 03/11/20  Yes 03/13/20, MD    Past Medical History:  Diagnosis Date  . Attention and concentration deficit 07/21/2019  . Contact lens/glasses fitting   . Depression   . Forgetfulness 07/21/2019  . Gallstones   . 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 No pertinent past medical history     Past Surgical History:  Procedure Laterality Date  . CHOLECYSTECTOMY  05/08/2012   Procedure: LAPAROSCOPIC CHOLECYSTECTOMY WITH INTRAOPERATIVE CHOLANGIOGRAM;  Surgeon: 05/10/2012, MD;  Location: [facility];  Service: General;  Laterality: N/A;  . ENDOMETRIAL ABLATION  2000    Social History   Tobacco Use  . Smoking status: Passive Smoke Exposure - Never Smoker  . Smokeless tobacco: Never Used  Substance Use Topics  . Alcohol use: Yes    Alcohol/week: 3.0 standard drinks    Types: 2 Glasses of wine, 1 Shots of liquor per week    Comment: occ    Family History  Problem Relation Age of Onset  . Breast cancer Mother 71  . Diabetes Father   . Hypertension Father   .  Hyperlipidemia Father   . Cancer Maternal Grandmother        breast    Review of Systems  Constitutional: Negative for chills and fever.  Respiratory: Negative for cough and shortness of breath.   Cardiovascular: Negative for chest pain, palpitations and leg swelling.  Gastrointestinal: Negative for abdominal pain, nausea and vomiting.     OBJECTIVE:  Today's Vitals   05/10/20 0947  BP: 108/73  Pulse: 75  Temp: 98 F (36.7 C)  SpO2: 97%  Weight: 189 lb 12.8 oz (86.1 kg)  Height: 6' (1.829 m)   Body mass index is 25.74 kg/m.   Wt Readings from Last 3 Encounters:  05/10/20 189 lb 12.8 oz (86.1 kg)  11/06/19 188 lb (85.3 kg)  07/21/19 181 lb (82.1 kg)     Physical Exam Vitals and nursing note reviewed.  Constitutional:      Appearance:  She is well-developed.  HENT:     Head: Normocephalic and atraumatic.     Mouth/Throat:     Pharynx: No oropharyngeal exudate.  Eyes:     General: No scleral icterus.    Extraocular Movements: Extraocular movements intact.     Conjunctiva/sclera: Conjunctivae normal.     Pupils: Pupils are equal, round, and reactive to light.  Cardiovascular:     Rate and Rhythm: Normal rate and regular rhythm.     Heart sounds: Normal heart sounds. No murmur heard.  No friction rub. No gallop.   Pulmonary:     Effort: Pulmonary effort is normal.     Breath sounds: Normal breath sounds. No wheezing, rhonchi or rales.  Musculoskeletal:     Cervical back: Neck supple.  Skin:    General: Skin is warm and dry.  Neurological:     Mental Status: She is alert and oriented to person, place, and time.     No results found for this or any previous visit (from the past 24 hour(s)).  No results found.   ASSESSMENT and PLAN  1. Attention deficit hyperactivity disorder (ADHD), combined type Controlled. Continue current regime. UDS done today. pmp appropriate. Call in 3 months for med refills. In office followup in 6 months.  2. Mixed anxiety and depressive disorder Doing well, has been stable for almost 2 years. Trial off lexapro is reasonable. Discussed weaning. Cont with counseling.  3. Medication management - ToxASSURE Select 13 (MW), Urine  Other orders - amphetamine-dextroamphetamine (ADDERALL) 20 MG tablet; Take 1 tablet (20 mg total) by mouth 2 (two) times daily. - amphetamine-dextroamphetamine (ADDERALL) 20 MG tablet; Take 1 tablet (20 mg total) by mouth 2 (two) times daily. Ok to fill 30 days after signature - amphetamine-dextroamphetamine (ADDERALL) 20 MG tablet; Take 1 tablet (20 mg total) by mouth 2 (two) times daily. Ok to fill in 60 days after signature  Return in about 6 months (around 11/10/2020).  For ADD/mood Patient to schedule her CPE with pap at her convenience     Myles Lipps, MD Primary Care at [facility] [address] [place], [place] [address] Ph.  [phone] Fax [phone]

## 2020-05-13 LAB — TOXASSURE SELECT 13 (MW), URINE

## 2020-05-24 ENCOUNTER — Other Ambulatory Visit: Payer: Self-pay

## 2020-05-24 ENCOUNTER — Emergency Department (HOSPITAL_COMMUNITY): Payer: Self-pay

## 2020-05-24 ENCOUNTER — Emergency Department (HOSPITAL_COMMUNITY)
Admission: EM | Admit: 2020-05-24 | Discharge: 2020-05-24 | Disposition: A | Discharge status: Home / Self Care | Payer: Self-pay | Attending: Emergency Medicine | Admitting: Emergency Medicine

## 2020-05-24 ENCOUNTER — Encounter (HOSPITAL_COMMUNITY): Payer: Self-pay

## 2020-05-24 DIAGNOSIS — Y999 Unspecified external cause status: Secondary | ICD-10-CM | POA: Insufficient documentation

## 2020-05-24 DIAGNOSIS — Z7722 Contact with and (suspected) exposure to environmental tobacco smoke (acute) (chronic): Secondary | ICD-10-CM | POA: Insufficient documentation

## 2020-05-24 DIAGNOSIS — S99912A Unspecified injury of left ankle, initial encounter: Secondary | ICD-10-CM

## 2020-05-24 DIAGNOSIS — Y939 Activity, unspecified: Secondary | ICD-10-CM | POA: Insufficient documentation

## 2020-05-24 DIAGNOSIS — S9002XA Contusion of left ankle, initial encounter: Secondary | ICD-10-CM | POA: Insufficient documentation

## 2020-05-24 DIAGNOSIS — Y929 Unspecified place or not applicable: Secondary | ICD-10-CM | POA: Insufficient documentation

## 2020-05-24 DIAGNOSIS — Z79899 Other long term (current) drug therapy: Secondary | ICD-10-CM | POA: Insufficient documentation

## 2020-05-24 DIAGNOSIS — W208XXA Other cause of strike by thrown, projected or falling object, initial encounter: Secondary | ICD-10-CM | POA: Insufficient documentation

## 2020-05-24 MED ORDER — HYDROCODONE-ACETAMINOPHEN 5-325 MG PO TABS
1.0000 | ORAL_TABLET | Freq: Four times a day (QID) | ORAL | 0 refills | Status: DC | PRN
Start: 2020-05-24 — End: 2021-01-26

## 2020-05-24 MED ORDER — HYDROCODONE-ACETAMINOPHEN 5-325 MG PO TABS
1.0000 | ORAL_TABLET | Freq: Once | ORAL | Status: AC
  Administered 2020-05-24: 1 via ORAL
  Filled 2020-05-24: qty 1

## 2020-05-24 NOTE — Discharge Instructions (Addendum)
You have injured your ankle.  The x-ray did not show a fracture but it could be either hidden fracture or other injury such as ligamentous injury.  Use the boot to help with immobility.  Follow-up with orthopedic surgery as needed.

## 2020-05-24 NOTE — ED Provider Notes (Signed)
[facility]-EMERGENCY DEPT Provider Note   CSN: [id number] Arrival date & time: 05/24/20  2101     History Chief Complaint  Patient presents with  . Ankle Injury    Samantha Parker is a 49 y.o. female.  HPI Patient presents with left ankle injury.  Reportedly was moving a desk and fell landing on her left ankle.  States she is got severe pain.  States pain is like nothing is ever felt before.  EMS gave her total of 200 mcg of fentanyl.  States the pain is still there.  No other injury.  Patient hopes that she is able to go home because tomorrow is her birthday.    Past Medical History:  Diagnosis Date  . Attention and concentration deficit 07/21/2019  . Contact lens/glasses fitting   . Depression   . Forgetfulness 07/21/2019  . Gallstones   . 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 No pertinent past medical history     Patient Active Problem List   Diagnosis Date Noted  . History of colonic polyps 05/10/2020  . Attention deficit hyperactivity disorder (ADHD), combined type 11/06/2019  . 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 Diverticulosis of colon 10/07/2017    Past Surgical History:  Procedure Laterality Date  . CHOLECYSTECTOMY  05/08/2012   Procedure: LAPAROSCOPIC CHOLECYSTECTOMY WITH INTRAOPERATIVE CHOLANGIOGRAM;  Surgeon: Cherylynn Ridges, MD;  [facility];  Service: General;  Laterality: N/A;  . ENDOMETRIAL ABLATION  2000     OB History   No obstetric history on file.     Family History  Problem Relation Age of Onset  . Breast cancer Mother 45  . Diabetes Father   . Hypertension Father   . Hyperlipidemia Father   . Cancer Maternal Grandmother        breast    Social History   Tobacco Use  . Smoking status: Passive Smoke Exposure - Never Smoker  . Smokeless tobacco: Never Used  Vaping Use  . Vaping Use: Never used  Substance Use Topics  . Alcohol use: Yes    Alcohol/week: 3.0  standard drinks    Types: 2 Glasses of wine, 1 Shots of liquor per week    Comment: occ  . Drug use: No    Home Medications Prior to Admission medications   Medication Sig Start Date End Date Taking? Authorizing Provider  amphetamine-dextroamphetamine (ADDERALL) 20 MG tablet Take 1 tablet (20 mg total) by mouth 2 (two) times daily. 05/10/20 06/09/20  Myles Lipps, MD  amphetamine-dextroamphetamine (ADDERALL) 20 MG tablet Take 1 tablet (20 mg total) by mouth 2 (two) times daily. Ok to fill 30 days after signature 05/10/20   Myles Lipps, MD  amphetamine-dextroamphetamine (ADDERALL) 20 MG tablet Take 1 tablet (20 mg total) by mouth 2 (two) times daily. Ok to fill in 60 days after signature 05/10/20 06/09/20  Myles Lipps, MD  escitalopram (LEXAPRO) 20 MG tablet Take 1 tablet (20 mg total) by mouth daily. 03/11/20   Myles Lipps, MD  HYDROcodone-acetaminophen (NORCO/VICODIN) 5-325 MG tablet Take 1-2 tablets by mouth every 6 (six) hours as needed. 05/24/20   Benjiman Core, MD    Allergies    Penicillins  Review of Systems   Review of Systems  Constitutional: Negative for appetite change.  Respiratory: Negative for shortness of breath.   Cardiovascular: Negative for chest pain.  Gastrointestinal: Negative for abdominal pain.  Genitourinary: Negative for flank pain.  Musculoskeletal:  Left anterior ankle/foot pain.  Skin: Positive for wound.  Neurological: Negative for weakness.    Physical Exam Updated Vital Signs BP 108/78   Pulse 90   Temp (!) 97.5 F (36.4 C) (Oral)   Resp 12   LMP  (LMP Unknown)   SpO2 96%   Physical Exam Vitals and nursing note reviewed.  HENT:     Head: Atraumatic.  Cardiovascular:     Rate and Rhythm: Regular rhythm.  Pulmonary:     Breath sounds: Normal breath sounds.  Musculoskeletal:        General: Tenderness present.     Comments: Tenderness to anterior left lower leg at the ankle.  Some ecchymosis.  Sensation intact over  foot.  Pain with movement of the ankle.  Some swelling anteriorly and somewhat laterally.  Skin:    General: Skin is warm.     Capillary Refill: Capillary refill takes less than 2 seconds.  Neurological:     Mental Status: She is alert and oriented to person, place, and time.       ED Results / Procedures / Treatments   Labs (all labs ordered are listed, but only abnormal results are displayed) Labs Reviewed - No data to display  EKG None  Radiology DG Ankle Complete Left  Result Date: 05/24/2020 CLINICAL DATA:  Injury ankle pain EXAM: LEFT ANKLE COMPLETE - 3+ VIEW COMPARISON:  None. FINDINGS: There is no evidence of fracture, dislocation, or joint effusion. There is no evidence of arthropathy or other focal bone abnormality. Focal soft tissue swelling seen along the anterior ankle. A small ankle joint effusion is seen. IMPRESSION: No acute osseous abnormality. Focal soft tissue swelling around the anterior ankle. A small ankle joint effusion is present. Electronically Signed   By: Jonna Clark M.D.   On: 05/24/2020 21:31    Procedures Procedures (including critical care time)  Medications Ordered in ED Medications - No data to display  ED Course  I have reviewed the triage vital signs and the nursing notes.  Pertinent labs & imaging results that were available during my care of the patient were reviewed by me and considered in my medical decision making (see chart for details).    MDM Rules/Calculators/A&P                          Patient with left ankle injury.  Reportedly had a desk fall onto it.  Ecchymosis and bruising on the front of the ankle.  Pain with movement.  X-ray overall reassuring.  Has soft tissue swelling and small joint effusion but no fracture seen.  Treated with cam walker and crutches.  Pain medicine given.  Follow-up with orthopedic surgery as needed. Final Clinical Impression(s) / ED Diagnoses Final diagnoses:  Left ankle injury, initial encounter     Rx / DC Orders ED Discharge Orders         Ordered    HYDROcodone-acetaminophen (NORCO/VICODIN) 5-325 MG tablet  Every 6 hours PRN        05/24/20 2203           Benjiman Core, MD 05/24/20 2206

## 2020-05-24 NOTE — ED Triage Notes (Signed)
Patient arrived via [facility] after moving a desk and it falling on her left ankle, swelling noted to extremity. 200 mcg Fentanyl given en route.

## 2020-05-31 NOTE — Addendum Note (Signed)
Addended by: Myles Lipps on: 05/31/2020 02:25 PM   Modules accepted: Orders

## 2020-06-03 NOTE — Addendum Note (Signed)
Addended by: Argentina Ponder on: 06/03/2020 09:36 AM   Modules accepted: Orders

## 2020-07-05 NOTE — Telephone Encounter (Signed)
na

## 2020-07-11 ENCOUNTER — Telehealth: Payer: Self-pay | Admitting: Physician Assistant

## 2020-07-11 NOTE — Telephone Encounter (Signed)
Pt called and stated that her Rx bag was left when she was out of town and her amphetamine-dextroamphetamine (ADDERALL) 20 MG tablet [[id number]] was left in there pt stated she will get it tomorrow from pharmacy but pt is needing it today if possible. Pt is aware of our policy that it takes up to 48-72 hours for a medication refill.   Pt would like a nurse to give her a call if this is possible. Please advise.

## 2020-07-11 NOTE — Telephone Encounter (Signed)
Pt is at office and she is stating if we just call the pharmacy they will fill her script.Pharmacy  [phone] [place] [place] - [address] AT [facility] [address] [facility] [address] [facility] [address]  [address], [place] [address]  Phone:  [phone] Fax:  [phone]     Please advise as soon as you can. She really needs this script for her job.

## 2020-07-12 ENCOUNTER — Other Ambulatory Visit: Payer: Self-pay

## 2020-07-12 NOTE — Telephone Encounter (Signed)
Called patient and she confirmed she is able to pick up her medication from the pharmacy. Reason for early request was due to her leaving her make up bag in [facility] with medications in it.

## 2020-08-11 ENCOUNTER — Ambulatory Visit (INDEPENDENT_AMBULATORY_CARE_PROVIDER_SITE_OTHER): Payer: 59 | Admitting: Family Medicine

## 2020-08-11 ENCOUNTER — Other Ambulatory Visit: Payer: Self-pay

## 2020-08-11 VITALS — BP 116/77 | HR 88 | Temp 98.0°F | Resp 15 | Ht 72.0 in | Wt 193.5 lb

## 2020-08-11 DIAGNOSIS — F902 Attention-deficit hyperactivity disorder, combined type: Secondary | ICD-10-CM | POA: Diagnosis not present

## 2020-08-11 MED ORDER — AMPHETAMINE-DEXTROAMPHETAMINE 20 MG PO TABS: 20.0000 mg | ORAL_TABLET | Freq: Two times a day (BID) | ORAL | 0 refills | Status: DC

## 2020-08-11 NOTE — Progress Notes (Signed)
Patient ID: Samantha Parker, female    DOB: 1971/08/29  Age: 49 y.o. MRN: [id number]  Chief Complaint  Patient presents with  . ADHD    pt here to get refill on Addrall no concerns TOC in February     Subjective:  Patient has a history of attention deficit disorder.  She has been on medicine for this for at least 3 or 4 years, but physicians here have kept changing on her.  She is seeing me for a regular refill today though is being plugged in with one of the other physicians in February.  Current allergies, medications, problem list, past/family and social histories reviewed.  Objective:  BP 116/77   Pulse 88   Temp 98 F (36.7 C) (Temporal)   Resp 15   Ht 6' (1.829 m)   Wt 193 lb 8 oz (87.8 kg)   SpO2 96%   BMI 26.24 kg/m   Doing well on her medications.  Has not had any today.  Is anxious because she has had weight for a long time today.  Assessment & Plan:   Assessment: 1. Attention deficit hyperactivity disorder (ADHD), combined type       Plan: I had some difficulty getting the 3  refills printed for me to sign.  A duplicate November 1 printed and it was voided and shredded.  She received copies prewritten for today, December, and January which she is to turn into her pharmacy for them to put on file for her.  December 18 and January 17. No orders of the defined types were placed in this encounter.   Meds ordered this encounter  Medications  . DISCONTD: amphetamine-dextroamphetamine (ADDERALL) 20 MG tablet    Sig: Take 1 tablet (20 mg total) by mouth 2 (two) times daily. Ok to fill 30 days after signature    Dispense:  60 tablet    Refill:  0  . amphetamine-dextroamphetamine (ADDERALL) 20 MG tablet    Sig: Take 1 tablet (20 mg total) by mouth 2 (two) times daily. Ok to fill 30 days after signature    Dispense:  60 tablet    Refill:  0  . DISCONTD: amphetamine-dextroamphetamine (ADDERALL) 20 MG tablet    Sig: Take 1 tablet (20 mg total) by mouth 2 (two) times  daily.    Dispense:  60 tablet    Refill:  0  . amphetamine-dextroamphetamine (ADDERALL) 20 MG tablet    Sig: Take 1 tablet (20 mg total) by mouth 2 (two) times daily.    Dispense:  60 tablet    Refill:  0         Patient Instructions    Continue daily adderall  Return 3 months   If you have lab work done today you will be contacted with your lab results within the next 2 weeks.  If you have not heard from [place] then please contact us. The fastest way to get your results is to register for My Chart.   IF you received an x-ray today, you will receive an invoice from [facility]. Please contact [facility] at [phone] with questions or concerns regarding your invoice.   IF you received labwork today, you will receive an invoice from [facility]. Please contact [facility] at [phone] with questions or concerns regarding your invoice.   Our billing staff will not be able to assist you with questions regarding bills from these companies.  You will be contacted with the lab results as soon as they are available.  The fastest way to get your results is to activate your My Chart account. Instructions are located on the last page of this paperwork. If you have not heard from [place] regarding the results in 2 weeks, please contact this office.        Return in about 3 months (around 11/11/2020).   Janace Hoard, MD 08/11/2020

## 2020-08-11 NOTE — Patient Instructions (Addendum)
  Continue daily adderall  Return 3 months   If you have lab work done today you will be contacted with your lab results within the next 2 weeks.  If you have not heard from [place] then please contact us. The fastest way to get your results is to register for My Chart.   IF you received an x-ray today, you will receive an invoice from [facility]. Please contact [facility] at [phone] with questions or concerns regarding your invoice.   IF you received labwork today, you will receive an invoice from [facility]. Please contact [facility] at [phone] with questions or concerns regarding your invoice.   Our billing staff will not be able to assist you with questions regarding bills from these companies.  You will be contacted with the lab results as soon as they are available. The fastest way to get your results is to activate your My Chart account. Instructions are located on the last page of this paperwork. If you have not heard from [place] regarding the results in 2 weeks, please contact this office.

## 2020-10-12 ENCOUNTER — Other Ambulatory Visit: Payer: Self-pay | Admitting: Family Medicine

## 2020-10-12 DIAGNOSIS — F902 Attention-deficit hyperactivity disorder, combined type: Secondary | ICD-10-CM

## 2020-10-12 MED ORDER — AMPHETAMINE-DEXTROAMPHETAMINE 20 MG PO TABS: 20.0000 mg | ORAL_TABLET | Freq: Two times a day (BID) | ORAL | 0 refills | Status: DC

## 2020-10-12 NOTE — Telephone Encounter (Signed)
I have called the pharmacy and they stated that the last script pt picked up was for 09/10/20.   Dr. Alwyn Ren was suppose to write one for Jan however there was an error with the year of the script.   Please advise if you are willing to send in this medication for pt. Pt's next schedule appt is with you on 11/10/20.  Patient is requesting a refill of the following medications: Requested Prescriptions   Pending Prescriptions Disp Refills  . amphetamine-dextroamphetamine (ADDERALL) 20 MG tablet 60 tablet 0    Sig: Take 1 tablet (20 mg total) by mouth 2 (two) times daily.    Date of patient request: 10/12/20 Last office visit: 08/11/20 w/ HOPPER Date of last refill: 09/10/20 Last refill amount: 60 +0 Follow up time period per chart: 11/10/20 with [facility]   .

## 2020-10-12 NOTE — Telephone Encounter (Signed)
PATIENT CAME IN AND DR/ HOPPER HAD WRITTEN 3 MONTH SUPPLY . HE WROTE FOR Jerilee Field ,DECMEBER AND January. BUT FOR jANUARY HE MADE A MISTAKE AND WRITE IT FOR January 24   PLEASE CALL PHARMACY   [phone] [place],  - [address] AT [facility] [address]  [address] [place] [address]  Phone:  [phone] Fax:  [phone]  DEA #:  [id number]

## 2020-10-12 NOTE — Telephone Encounter (Signed)
Last filled 12/18. New rx sent with today's date.

## 2020-11-10 ENCOUNTER — Encounter: Payer: Self-pay | Admitting: Family Medicine

## 2020-11-14 ENCOUNTER — Ambulatory Visit (INDEPENDENT_AMBULATORY_CARE_PROVIDER_SITE_OTHER): Payer: 59 | Admitting: Family Medicine

## 2020-11-14 ENCOUNTER — Other Ambulatory Visit: Payer: Self-pay

## 2020-11-14 ENCOUNTER — Encounter: Payer: Self-pay | Admitting: Family Medicine

## 2020-11-14 VITALS — BP 114/78 | HR 87 | Temp 97.7°F | Ht 72.0 in | Wt 196.0 lb

## 2020-11-14 DIAGNOSIS — Z130 Encounter for screening for diseases of the blood and blood-forming organs and certain disorders involving the immune mechanism: Secondary | ICD-10-CM

## 2020-11-14 DIAGNOSIS — F4322 Adjustment disorder with anxiety: Secondary | ICD-10-CM

## 2020-11-14 DIAGNOSIS — Z13228 Encounter for screening for other metabolic disorders: Secondary | ICD-10-CM | POA: Diagnosis not present

## 2020-11-14 DIAGNOSIS — Z1329 Encounter for screening for other suspected endocrine disorder: Secondary | ICD-10-CM

## 2020-11-14 DIAGNOSIS — F902 Attention-deficit hyperactivity disorder, combined type: Secondary | ICD-10-CM | POA: Diagnosis not present

## 2020-11-14 MED ORDER — ESCITALOPRAM OXALATE 20 MG PO TABS: 20.0000 mg | ORAL_TABLET | Freq: Every day | ORAL | 3 refills | Status: DC

## 2020-11-14 MED ORDER — AMPHETAMINE-DEXTROAMPHETAMINE 20 MG PO TABS: 20.0000 mg | ORAL_TABLET | Freq: Two times a day (BID) | ORAL | 0 refills | Status: DC

## 2020-11-14 NOTE — Progress Notes (Signed)
2/21/202212:16 PM  Heide Guile 04-Jul-1971, 50 y.o., female [id number]  Chief Complaint  Patient presents with  . Medication Refill    Adderall     HPI:   Patient is a 50 y.o. female with past medical history significant for ADHD who presents today for medication refills.  PDMP reviewed. Last refill 10/12/20. Last toxassure 05/10/20. No drugs seen. Needs repeat.  Has an eye appointment scheduled for today  Takes adderall 43m bid Lexapro 267mdaily Has been stable on this medication regimen for many years Has been out of medication and unable to get refills for a few days  Has not had labs done since 2020  Pap thru GYN   [facility] 11/14/2020 08/11/2020 05/10/2020  Decreased Interest 0 0 0  Down, Depressed, Hopeless 0 0 0  PHQ - 2 Score 0 0 0  Altered sleeping 0 - -  Tired, decreased energy 0 - -  Change in appetite 0 - -  Feeling bad or failure about yourself  0 - -  Trouble concentrating 0 - -  Moving slowly or fidgety/restless 0 - -  Suicidal thoughts 0 - -  PHQ-9 Score 0 - -  Difficult doing work/chores Not difficult at all - -    Fall Risk  11/14/2020 08/11/2020 05/10/2020 11/06/2019 08/21/2019  Falls in the past year? 0 0 0 0 0  Number falls in past yr: 0 0 0 0 0  Injury with Fall? 0 0 0 0 0  Risk for fall due to : - No Fall Risks - - -  Follow up Falls evaluation completed Falls evaluation completed - Falls evaluation completed -     Allergies  Allergen Reactions  . Penicillins Nausea Only and Rash    Rash will spread all over the body.    Prior to Admission medications   Medication Sig Start Date End Date Taking? Authorizing Provider  amphetamine-dextroamphetamine (ADDERALL) 20 MG tablet Take 1 tablet (20 mg total) by mouth 2 (two) times daily. Ok to fill in 60 days after signature 05/10/20 06/09/20  SaJacelyn PiIrLilia ArgueMD  amphetamine-dextroamphetamine (ADDERALL) 20 MG tablet Take 1 tablet (20 mg total) by mouth 2 (two) times  daily. Ok to fill 30 days after signature 09/10/20   HoPosey BoyerMD  amphetamine-dextroamphetamine (ADDERALL) 20 MG tablet Take 1 tablet (20 mg total) by mouth 2 (two) times daily. 10/12/20 11/11/20  GrWendie AgresteMD  HYDROcodone-acetaminophen (NORCO/VICODIN) 5-325 MG tablet Take 1-2 tablets by mouth every 6 (six) hours as needed. 05/24/20   PiDavonna BellingMD    Past Medical History:  Diagnosis Date  . Attention and concentration deficit 07/21/2019  . Contact lens/glasses fitting   . Depression   . Forgetfulness 07/21/2019  . Gallstones   . 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 No pertinent past medical history     Past Surgical History:  Procedure Laterality Date  . CHOLECYSTECTOMY  05/08/2012   Procedure: LAPAROSCOPIC CHOLECYSTECTOMY WITH INTRAOPERATIVE CHOLANGIOGRAM;  Surgeon: JaGwenyth OberMD;  [place]: [place];  Laterality: N/A;  . ENDOMETRIAL ABLATION  2000    Social History   Tobacco Use  . Smoking status: Passive Smoke Exposure - Never Smoker  . Smokeless tobacco: Never Used  Substance Use Topics  . Alcohol use: Yes    Alcohol/week: 3.0 standard drinks    Types: 2 Glasses of wine, 1 Shots of liquor per week    Comment:  occ    Family History  Problem Relation Age of Onset  . Breast cancer Mother 81  . Diabetes Father   . Hypertension Father   . Hyperlipidemia Father   . Cancer Maternal Grandmother        breast    Review of Systems  Constitutional: Negative for chills, fever and malaise/fatigue.  Eyes: Negative for blurred vision and double vision.  Respiratory: Negative for cough, shortness of breath and wheezing.   Cardiovascular: Negative for chest pain, palpitations and leg swelling.  Gastrointestinal: Negative for abdominal pain, blood in stool, constipation, diarrhea, heartburn, nausea and vomiting.  Genitourinary: Negative for dysuria, frequency and hematuria.  Musculoskeletal: Negative  for back pain and joint pain.  Skin: Negative for rash.  Neurological: Negative for dizziness, weakness and headaches.  Psychiatric/Behavioral: The patient is nervous/anxious.      OBJECTIVE:  Today's Vitals   11/14/20 1134  BP: 114/78  Pulse: 87  Temp: 97.7 F (36.5 C)  SpO2: 100%  Weight: 196 lb (88.9 kg)  Height: 6' (1.829 m)   Body mass index is 26.58 kg/m.   Physical Exam Constitutional:      General: She is not in acute distress.    Appearance: Normal appearance. She is not ill-appearing.  HENT:     Head: Normocephalic.  Cardiovascular:     Rate and Rhythm: Normal rate and regular rhythm.     Pulses: Normal pulses.     Heart sounds: Normal heart sounds. No murmur heard. No friction rub. No gallop.   Pulmonary:     Effort: Pulmonary effort is normal. No respiratory distress.     Breath sounds: Normal breath sounds. No stridor. No wheezing, rhonchi or rales.  Abdominal:     General: Bowel sounds are normal.     Palpations: Abdomen is soft.     Tenderness: There is no abdominal tenderness.  Musculoskeletal:     Right lower leg: No edema.     Left lower leg: No edema.  Skin:    General: Skin is warm and dry.  Neurological:     Mental Status: She is alert and oriented to person, place, and time.  Psychiatric:        Mood and Affect: Mood is anxious.        Behavior: Behavior normal.     No results found for this or any previous visit (from the past 24 hour(s)).  No results found.   ASSESSMENT and PLAN  Problem List Items Addressed This Visit      Other   Attention deficit hyperactivity disorder (ADHD), combined type - Primary (Chronic)   Relevant Medications   amphetamine-dextroamphetamine (ADDERALL) 20 MG tablet   Other Relevant Orders   ToxASSURE Select 13 (MW), Urine    Other Visit Diagnoses    Adjustment disorder with anxious mood       Relevant Medications   escitalopram (LEXAPRO) 20 MG tablet   Screening for endocrine, metabolic and  immunity disorder       Relevant Orders   CMP14+EGFR   CBC   TSH   Lipid Panel   Vitamin D, 25-hydroxy   Hemoglobin A1c      Plan . Will follow up on lab results . Medication refills sent . Needs toxasure next appt given she has been out of medications    Return in about 6 months (around 05/14/2021).    Huston Foley Rosan Calbert, FNP-BC Primary Care at [place], [place] [address] Ph.  [phone] Fax [phone]

## 2020-11-14 NOTE — Patient Instructions (Addendum)
  Health Maintenance, Female Adopting a healthy lifestyle and getting preventive care are important in promoting health and wellness. Ask your health care provider about:  The right schedule for you to have regular tests and exams.  Things you can do on your own to prevent diseases and keep yourself healthy. What should I know about diet, weight, and exercise? Eat a healthy diet  Eat a diet that includes plenty of vegetables, fruits, low-fat dairy products, and lean protein.  Do not eat a lot of foods that are high in solid fats, added sugars, or sodium.   Maintain a healthy weight Body mass index (BMI) is used to identify weight problems. It estimates body fat based on height and weight. Your health care provider can help determine your BMI and help you achieve or maintain a healthy weight. Get regular exercise Get regular exercise. This is one of the most important things you can do for your health. Most adults should:  Exercise for at least 150 minutes each week. The exercise should increase your heart rate and make you sweat (moderate-intensity exercise).  Do strengthening exercises at least twice a week. This is in addition to the moderate-intensity exercise.  Spend less time sitting. Even light physical activity can be beneficial. Watch cholesterol and blood lipids Have your blood tested for lipids and cholesterol at 52 years of age, then have this test every 5 years. Have your cholesterol levels checked more often if:  Your lipid or cholesterol levels are high.  You are older than 52 years of age.  You are at high risk for heart disease. What should I know about cancer screening? Depending on your health history and family history, you may need to have cancer screening at various ages. This may include screening for:  Breast cancer.  Cervical cancer.  Colorectal cancer.  Skin cancer.  Lung cancer. What should I know about heart disease, diabetes, and high blood  pressure? Blood pressure and heart disease  High blood pressure causes heart disease and increases the risk of stroke. This is more likely to develop in people who have high blood pressure readings, are of African descent, or are overweight.  Have your blood pressure checked: ? Every 3-5 years if you are 18-39 years of age. ? Every year if you are 40 years old or older. Diabetes Have regular diabetes screenings. This checks your fasting blood sugar level. Have the screening done:  Once every three years after age 40 if you are at a normal weight and have a low risk for diabetes.  More often and at a younger age if you are overweight or have a high risk for diabetes. What should I know about preventing infection? Hepatitis B If you have a higher risk for hepatitis B, you should be screened for this virus. Talk with your health care provider to find out if you are at risk for hepatitis B infection. Hepatitis C Testing is recommended for:  Everyone born from 1945 through 1965.  Anyone with known risk factors for hepatitis C. Sexually transmitted infections (STIs)  Get screened for STIs, including gonorrhea and chlamydia, if: ? You are sexually active and are younger than 52 years of age. ? You are older than 52 years of age and your health care provider tells you that you are at risk for this type of infection. ? Your sexual activity has changed since you were last screened, and you are at increased risk for chlamydia or gonorrhea. Ask your health   care provider if you are at risk.  Ask your health care provider about whether you are at high risk for HIV. Your health care provider may recommend a prescription medicine to help prevent HIV infection. If you choose to take medicine to prevent HIV, you should first get tested for HIV. You should then be tested every 3 months for as long as you are taking the medicine. Pregnancy  If you are about to stop having your period (premenopausal) and  you may become pregnant, seek counseling before you get pregnant.  Take 400 to 800 micrograms (mcg) of folic acid every day if you become pregnant.  Ask for birth control (contraception) if you want to prevent pregnancy. Osteoporosis and menopause Osteoporosis is a disease in which the bones lose minerals and strength with aging. This can result in bone fractures. If you are 65 years old or older, or if you are at risk for osteoporosis and fractures, ask your health care provider if you should:  Be screened for bone loss.  Take a calcium or vitamin D supplement to lower your risk of fractures.  Be given hormone replacement therapy (HRT) to treat symptoms of menopause. Follow these instructions at home: Lifestyle  Do not use any products that contain nicotine or tobacco, such as cigarettes, e-cigarettes, and chewing tobacco. If you need help quitting, ask your health care provider.  Do not use street drugs.  Do not share needles.  Ask your health care provider for help if you need support or information about quitting drugs. Alcohol use  Do not drink alcohol if: ? Your health care provider tells you not to drink. ? You are pregnant, may be pregnant, or are planning to become pregnant.  If you drink alcohol: ? Limit how much you use to 0-1 drink a day. ? Limit intake if you are breastfeeding.  Be aware of how much alcohol is in your drink. In the [place] one drink equals one 12 oz bottle of beer (355 mL), one 5 oz glass of wine (148 mL), or one 1 oz glass of hard liquor (44 mL). General instructions  Schedule regular health, dental, and eye exams.  Stay current with your vaccines.  Tell your health care provider if: ? You often feel depressed. ? You have ever been abused or do not feel safe at home. Summary  Adopting a healthy lifestyle and getting preventive care are important in promoting health and wellness.  Follow your health care provider's instructions about healthy  diet, exercising, and getting tested or screened for diseases.  Follow your health care provider's instructions on monitoring your cholesterol and blood pressure. This information is not intended to replace advice given to you by your health care provider. Make sure you discuss any questions you have with your health care provider. Document Revised: 09/03/2018 Document Reviewed: 09/03/2018 Elsevier Patient Education  2021 Elsevier Inc.   If you have lab work done today you will be contacted with your lab results within the next 2 weeks.  If you have not heard from us then please contact us. The fastest way to get your results is to register for My Chart.   IF you received an x-ray today, you will receive an invoice from [facility]. Please contact [facility] at [phone] with questions or concerns regarding your invoice.   IF you received labwork today, you will receive an invoice from [facility]. Please contact [facility] at [phone] with questions or concerns regarding your invoice.   Our billing   staff will not be able to assist you with questions regarding bills from these companies.  You will be contacted with the lab results as soon as they are available. The fastest way to get your results is to activate your My Chart account. Instructions are located on the last page of this paperwork. If you have not heard from us regarding the results in 2 weeks, please contact this office.      

## 2020-11-15 LAB — CBC
Hematocrit: 44.4 % (ref 34.0–46.6)
Hemoglobin: 14.9 g/dL (ref 11.1–15.9)
MCH: 29.7 pg (ref 26.6–33.0)
MCHC: 33.6 g/dL (ref 31.5–35.7)
MCV: 89 fL (ref 79–97)
Platelets: 387 10*3/uL (ref 150–450)
RBC: 5.01 x10E6/uL (ref 3.77–5.28)
RDW: 11.8 % (ref 11.7–15.4)
WBC: 5.9 10*3/uL (ref 3.4–10.8)

## 2020-11-15 LAB — LIPID PANEL
Chol/HDL Ratio: 2.8 ratio (ref 0.0–4.4)
Cholesterol, Total: 177 mg/dL (ref 100–199)
HDL: 63 mg/dL (ref >39)
LDL Chol Calc (NIH): 97 mg/dL (ref 0–99)
Triglycerides: 94 mg/dL (ref 0–149)
VLDL Cholesterol Cal: 17 mg/dL (ref 5–40)

## 2020-11-15 LAB — CMP14+EGFR
ALT: 15 IU/L (ref 0–32)
AST: 14 IU/L (ref 0–40)
Albumin/Globulin Ratio: 1.5 (ref 1.2–2.2)
Albumin: 4 g/dL (ref 3.8–4.9)
Alkaline Phosphatase: 103 IU/L (ref 44–121)
BUN/Creatinine Ratio: 15 (ref 9–23)
BUN: 11 mg/dL (ref 6–24)
Bilirubin Total: <0.2 mg/dL (ref 0.0–1.2)
CO2: 22 mmol/L (ref 20–29)
Calcium: 9.2 mg/dL (ref 8.7–10.2)
Chloride: 102 mmol/L (ref 96–106)
Creatinine, Ser: 0.71 mg/dL (ref 0.57–1.00)
GFR calc Af Amer: 114 mL/min/{1.73_m2} (ref >59)
GFR calc non Af Amer: 99 mL/min/{1.73_m2} (ref >59)
Globulin, Total: 2.7 g/dL (ref 1.5–4.5)
Glucose: 134 mg/dL — ABNORMAL HIGH (ref 65–99)
Potassium: 4.3 mmol/L (ref 3.5–5.2)
Sodium: 138 mmol/L (ref 134–144)
Total Protein: 6.7 g/dL (ref 6.0–8.5)

## 2020-11-15 LAB — HEMOGLOBIN A1C
Est. average glucose Bld gHb Est-mCnc: 114 mg/dL
Hgb A1c MFr Bld: 5.6 % (ref 4.8–5.6)

## 2020-11-15 LAB — TSH: TSH: 1.88 u[IU]/mL (ref 0.450–4.500)

## 2020-11-15 LAB — VITAMIN D 25 HYDROXY (VIT D DEFICIENCY, FRACTURES): Vit D, 25-Hydroxy: 17.5 ng/mL — ABNORMAL LOW (ref 30.0–100.0)

## 2020-11-15 NOTE — Progress Notes (Signed)
  Overall your labs look good. Your Vitamin D did come back quite low. I would recommend taking 2000 IU of Over the counter Vitamin D3 with food daily.

## 2020-12-29 ENCOUNTER — Telehealth: Payer: Self-pay

## 2020-12-29 NOTE — Telephone Encounter (Signed)
Patient was last seen by Macario Carls Just on 11/14/2020 and does not see a new provider until 01/03/2021. Are you able to fill a short supply of patient Adderall 20mg  until appointment. Patient has been out for almost 2 weeks.

## 2020-12-30 ENCOUNTER — Ambulatory Visit: Payer: Self-pay | Admitting: *Deleted

## 2020-12-30 NOTE — Telephone Encounter (Signed)
Patient is calling - she is frustrated because she has no one to refill her medication- she did everything she was supposed to do- kept appointments and follow ups- her PCP office closed and she has no resource to fill her medication until her appointment. She has been out for 2 weeks and she is very upset. I did validated her feeling- but she is very upset that no one can help her due to the medication class. Advised patient of options- Crisis center, UC/ED- but she is not wanting to go to these places today. Note sent to NP office for review and possible outreach to patient- patient has no other provider to follow up with. Reason for Disposition . [1] Prescription refill request for ESSENTIAL medicine (i.e., likelihood of harm to patient if not taken) AND [2] triager unable to refill per department policy . Symptoms interfere with work or school  Answer Assessment - Initial Assessment Questions 1. CONCERN: "What happened that made you call today?"     Anxiety over no RF on ADD medication 2. ANXIETY SYMPTOM SCREENING: "Can you describe how you have been feeling?"  (e.g., tense, restless, panicky, anxious, keyed up, trouble sleeping, trouble concentrating)     Overwhelmed- with work - she is having emotional swings 3. ONSET: "How long have you been feeling this way?"     2 weeks -out of medication 4. RECURRENT: "Have you felt this way before?"  If Yes, ask: "What happened that time?" "What helped these feelings go away in the past?"      Frustrated- patient has been on Adderal for over 2 years 5. RISK OF HARM - SUICIDAL IDEATION:  "Do you ever have thoughts of hurting or killing yourself?"  (e.g., yes, no, no but preoccupation with thoughts about death)   - INTENT:  "Do you have thoughts of hurting or killing yourself right NOW?" (e.g., yes, no, N/A)   - PLAN: "Do you have a specific plan for how you would do this?" (e.g., gun, knife, overdose, no plan, N/A)     Patient states she is not  suicidal 6. RISK OF HARM - HOMICIDAL IDEATION:  "Do you ever have thoughts of hurting or killing someone else?"  (e.g., yes, no, no but preoccupation with thoughts about death)   - INTENT:  "Do you have thoughts of hurting or killing someone right NOW?" (e.g., yes, no, N/A)   - PLAN: "Do you have a specific plan for how you would do this?" (e.g., gun, knife, no plan, N/A)      no 7. FUNCTIONAL IMPAIRMENT: "How have things been going for you overall? Have you had more difficulty than usual doing your normal daily activities?"  (e.g., better, same, worse; self-care, school, work, interactions)     Patient is very upset- she keeps saying she is so far behind  36. OTHER SYMPTOMS: "Do you have any other physical symptoms right now?" (e.g., chest pain, palpitations, difficulty breathing, fever)       Crying anxiety Patient wants validation that she did the right things and she didn't get back up from provider. She is very upset- she has been without her medication for 2 weeks and states she is so far behind- not having this medication has affected her work, home life. Patient advised regarding options: [facility], Urgent care- she states she does not have time today- she is overwhelmed.  Answer Assessment - Initial Assessment Questions 1. DRUG NAME: "What medicine do you need to have refilled?"  Adderal 2. REFILLS REMAINING: "How many refills are remaining?" (Note: The label on the medicine or pill bottle will show how many refills are remaining. If there are no refills remaining, then a renewal may be needed.)     none 3. EXPIRATION DATE: "What is the expiration date?" (Note: The label states when the prescription will expire, and thus can no longer be refilled.)     Patient has been out of medication for 2 weeks and she is very upset today 4. PRESCRIBING HCP: "Who prescribed it?" Reason: If prescribed by specialist, call should be referred to that group.     Former PCP-  office no longer open 5. SYMPTOMS: "Do you have any symptoms?"     Lack of concentration, emotional, feels like she can't catch up  Protocols used: MEDICATION REFILL AND RENEWAL CALL-A-AH, ANXIETY AND PANIC ATTACK-A-AH

## 2021-01-03 ENCOUNTER — Encounter (INDEPENDENT_AMBULATORY_CARE_PROVIDER_SITE_OTHER): Payer: Self-pay

## 2021-01-03 ENCOUNTER — Other Ambulatory Visit: Payer: Self-pay

## 2021-01-03 ENCOUNTER — Telehealth: Payer: Self-pay

## 2021-01-03 ENCOUNTER — Telehealth (INDEPENDENT_AMBULATORY_CARE_PROVIDER_SITE_OTHER): Payer: Self-pay | Admitting: Primary Care

## 2021-01-03 ENCOUNTER — Ambulatory Visit (INDEPENDENT_AMBULATORY_CARE_PROVIDER_SITE_OTHER): Payer: Self-pay | Admitting: Primary Care

## 2021-01-03 DIAGNOSIS — Z79899 Other long term (current) drug therapy: Secondary | ICD-10-CM

## 2021-01-03 NOTE — Telephone Encounter (Signed)
Patient was advised that she will need to use the behavioral health clinic information that was provided by [facility] to received Adderall medication. Patient verbalized understanding but was not happy that she has to go there.

## 2021-01-03 NOTE — Telephone Encounter (Signed)
Ms. Samantha Parker is a 50 year old female who presented to establish care and requesting medication refills.  Informed patient would be glad to establish care however unable to refill her Adderall.  Patient became hysterical and crying and felt like she was given a run around and a waste of time of coming. Call supervisor physician Dr. Delford Field to ask how to move forward explained he does not prescribe ADD medication and advised to go to walk-in clinic at behavioral health information directions and number provided to patient to go straight there.

## 2021-01-04 ENCOUNTER — Other Ambulatory Visit: Payer: Self-pay

## 2021-01-04 ENCOUNTER — Ambulatory Visit (HOSPITAL_COMMUNITY)
Admission: EM | Admit: 2021-01-04 | Discharge: 2021-01-04 | Disposition: A | Discharge status: Home / Self Care | Payer: 59 | Attending: Registered Nurse | Admitting: Registered Nurse

## 2021-01-04 ENCOUNTER — Encounter (HOSPITAL_COMMUNITY): Payer: Self-pay | Admitting: Registered Nurse

## 2021-01-04 DIAGNOSIS — F902 Attention-deficit hyperactivity disorder, combined type: Secondary | ICD-10-CM | POA: Diagnosis present

## 2021-01-04 DIAGNOSIS — F418 Other specified anxiety disorders: Secondary | ICD-10-CM

## 2021-01-04 NOTE — ED Provider Notes (Signed)
Behavioral Health Urgent Care Medical Screening Exam  Patient Name: Samantha Parker MRN: [id number] Date of Evaluation: 01/04/21 Chief Complaint:   Diagnosis:  Final diagnoses:  Attention deficit hyperactivity disorder (ADHD), combined type  Mixed anxiety and depressive disorder    History of Present illness: Samantha Parker is a 50 y.o. female patient presented to [facility] as a walk in with request of medication refill of her Adderall  Samantha Parker, 50 y.o., female patient seen face to face by this provider, consulted with Dr. Earlene Plater; and chart reviewed on 01/04/21.  On evaluation Samantha Parker reports her primary doctor was at [place] which has gone out of business and she saw her doctor last week and he was suppose to send in her prescription but for got to now she doesn't know where to get medication filled and she needs a primary doctor.  Patient states she was sent to the [facility] from "[facility]  Patient is upset "I've been sitting here for hours and now you are telling me that you can fill my prescription.  I'm wasting time out of work and this was just a waste of time.  I'm so freaking mad right now."  Patient is yelling, screaming, and crying.  Attempted to explain to patient why controled substance could not be refilled by this provider but I could give her resources for a primary doctor and outpatient psychiatric serves that could help her but patient refused to take the resources and stated that she just wanted to leave.  "I'm not suicidal, homicidal, or psychotic, I just came her to get my medication refilled and wasted 3 fucking hours waiting to be seen and it has gotten me so far behind that I look like I'm fucking crazy now."  Patient informed that she was told that refills could not be done but she insisted that she needed to see a provider.  Apologized for the waite and again offered resources to assist but patient refused.   During evaluation IVERY Parker is alert,  oriented x 4, she is angry and upset throughout the entire assessment.  She does not appear to be responding to internal/external stimuli or delusional thoughts.  Patient denies suicidal/self-harm/homicidal ideation, psychosis, and paranoia.      Psychiatric Specialty Exam  Presentation  General Appearance:Appropriate for Environment; Casual  Eye Contact:Good  Speech:Clear and Coherent; Normal Rate  Speech Volume:Increased  Handedness:Right   Mood and Affect  Mood:Anxious; Angry  Affect:Congruent; Tearful   Thought Process  Thought Processes:Coherent; Goal Directed  Descriptions of Associations:Intact  Orientation:Full (Time, Place and Person)  Thought Content:WDL    Hallucinations:None  Ideas of Reference:None  Suicidal Thoughts:No  Homicidal Thoughts:No   Sensorium  Memory:Immediate Good; Recent Good; Remote Poor  Judgment:Intact  Insight:Good   Executive Functions  Concentration:Good  Attention Span:Good  Recall:Good  Fund of Knowledge:Good  Language:Good   Psychomotor Activity  Psychomotor Activity:Normal   Assets  Assets:Communication Skills; Desire for Improvement; Financial Resources/Insurance; Housing; Physical Health; Resilience; Social Support; Transportation   Sleep  Sleep:Good  Number of hours: No data recorded  Nutritional Assessment (For OBS and [facility] admissions only) Has the patient had a weight loss or gain of 10 pounds or more in the last 3 months?: No Has the patient had a decrease in food intake/or appetite?: No Does the patient have dental problems?: No Does the patient have eating habits or behaviors that may be indicators of an eating disorder including binging or inducing vomiting?: No  Has the patient recently lost weight without trying?: No Has the patient been eating poorly because of a decreased appetite?: No Malnutrition Screening Tool Score: 0    Physical Exam: Physical Exam Vitals and nursing note  reviewed. Exam conducted with a chaperone present.  Constitutional:      General: She is not in acute distress.    Appearance: Normal appearance. She is not ill-appearing.  HENT:     Head: Normocephalic.  Eyes:     Pupils: Pupils are equal, round, and reactive to light.  Cardiovascular:     Rate and Rhythm: Normal rate.  Pulmonary:     Effort: Pulmonary effort is normal.  Musculoskeletal:        General: Normal range of motion.     Cervical back: Normal range of motion.  Skin:    General: Skin is warm and dry.  Neurological:     Mental Status: She is alert and oriented to person, place, and time.  Psychiatric:        Attention and Perception: Attention and perception normal. She does not perceive auditory or visual hallucinations.        Mood and Affect: Mood is anxious. Affect is angry and tearful.        Speech: Speech normal.        Behavior: Behavior is agitated.        Thought Content: Thought content normal. Thought content is not paranoid or delusional. Thought content does not include homicidal or suicidal ideation.        Cognition and Memory: Cognition and memory normal.        Judgment: Judgment is impulsive.    Review of Systems  Constitutional: Negative.   HENT: Negative.   Eyes: Negative.   Respiratory: Negative.   Cardiovascular: Negative.   Gastrointestinal: Negative.   Genitourinary: Negative.   Musculoskeletal: Negative.   Skin: Negative.   Neurological: Negative.   Endo/Heme/Allergies: Negative.   Psychiatric/Behavioral: Negative for hallucinations, substance abuse and suicidal ideas. The patient is nervous/anxious.        Patient states she has been taking Adderall for years and her provider forgot to send in her prescription before he left the company and now she has been out for 2 weeks and is having a hard time finding some one to fill medication   Blood pressure (!) 111/93, pulse 74, temperature 97.8 F (36.6 C), temperature source Temporal, resp.  rate 18, SpO2 100 %. There is no height or weight on file to calculate BMI.  Musculoskeletal: Strength & Muscle Tone: within normal limits Gait & Station: normal Patient leans: N/A   [facility] MSE Discharge Disposition for Follow up and Recommendations: Based on my evaluation the patient does not appear to have an emergency medical condition and can be discharged with resources and follow up care in outpatient services for Medication Management and Individual Therapy     Discharge Instructions      [email or web address] [address] [place], [place], [place] [address]  ~3.4 mi [phone]      Assunta Found, NP 01/04/2021, 7:14 PM

## 2021-01-04 NOTE — Discharge Instructions (Signed)
[email or web address] [id number] [place], [place], [place] [address]  ~3.4 mi [phone]

## 2021-01-04 NOTE — Progress Notes (Signed)
   01/04/21 1717  [facility] Triage Screening (Walk-ins at [facility] only)  How Did You Hear About Us? Self  What Is the Reason for Your Visit/Call Today? Patient presents stating she has been prescribed Adderall for many years for ADHD management.  [facility] recently closed and patient was not given an Rx.  She has had difficulty finding a provider to prescribe "controlled" medications.  She is feeling overwhelmed at this point, and is asking to see a provider to discuss options.  She denies SI, HI and AVH. She is a bit distressed by "all of this.  It's ridiculous when I just need a prescription for something I've been on for years."  How Long Has This Been Causing You Problems? 1 wk - 1 month  Have You Recently Had Any Thoughts About Hurting Yourself? No  Are You Planning to Commit Suicide/Harm Yourself At This time? No  Have you Recently Had Thoughts About Hurting Someone Else? No  Are You Planning To Harm Someone At This Time? No  Are you currently experiencing any auditory, visual or other hallucinations? No  Have You Used Any Alcohol or Drugs in the Past 24 Hours? No  Do you have any current medical co-morbidities that require immediate attention? No  What Do You Feel Would Help You the Most Today? Medication(s)  If access to [facility] was not available, would you have sought care in the Emergency Department? No  Determination of Need Routine (7 days)  Options For Referral Medication Management   

## 2021-01-04 NOTE — Progress Notes (Signed)
   01/04/21 1717  [facility] Triage Screening (Walk-ins at [facility] only)  How Did You Hear About [place]? Self  What Is the Reason for Your Visit/Call Today? Patient presents stating she has been prescribed Adderall for many years for ADHD management.  [facility] recently closed and patient was not given an Rx.  She has had difficulty finding a provider to prescribe "controlled" medications.  She is feeling overwhelmed at this point, and is asking to see a provider to discuss options.  She denies SI, HI and AVH. She is a bit distressed by "all of this.  It's ridiculous when I just need a prescription for something I've been on for years."  How Long Has This Been Causing You Problems? 1 wk - 1 month  Have You Recently Had Any Thoughts About Hurting Yourself? No  Are You Planning to Commit Suicide/Harm Yourself At This time? No  Have you Recently Had Thoughts About Hurting Someone Karolee Ohs? No  Are You Planning To Harm Someone At This Time? No  Are you currently experiencing any auditory, visual or other hallucinations? No  Have You Used Any Alcohol or Drugs in the Past 24 Hours? No  Do you have any current medical co-morbidities that require immediate attention? No  What Do You Feel Would Help You the Most Today? Medication(s)  If access to [facility] was not available, would you have sought care in the Emergency Department? No  Determination of Need Routine (7 days)  Options For Referral Medication Management

## 2021-01-05 ENCOUNTER — Ambulatory Visit: Payer: Self-pay | Admitting: Nurse Practitioner

## 2021-01-10 NOTE — Progress Notes (Signed)
Ms. Samantha Parker presented today to establish care she has been on ADD medication for a long period of time and anxiety medication.  I informed patient that I did not prescribe nor treat ADHD medication.  Patient became IRRITATED CRYING upset on the reason why she was there was for medication.  Has been given nothing but the runaround.  Called supervising physician at the time Dr. Delford Field explained the situation he was in agreement with sending her to behavioral health all information provided and a flyer for directions and not prescribing Adderall.

## 2021-01-13 ENCOUNTER — Ambulatory Visit: Payer: Self-pay | Admitting: *Deleted

## 2021-01-13 NOTE — Telephone Encounter (Signed)
Patient is having problem establishing care with PCP- patient was patient at [facility] and was left without a provider.  Patient contacted the [facility] and was scheduled as new patient at [facility]- the NP that practices there does not prescribe medication that patient is needing- Adderral. Patient was then sent to behavioral health and did not get the medication help she needed there and was told to follow up with PCP.  Patient is very frustrated and feels that she has been left stranded without a provider.  Problem #1- no PCP Consulted with supervisor and since this patient was a long term patient at [place]- we are reaching out to Dr Neva Seat to see if he can possibly see her and continue care for her. Patient is advised to call [place] office on Monday to follow up to this message. Patient may have to have virtual visit first due to her recent COVID diagnosis.  Problem #2- + COVID Patient states she went to recent concert and has tested + COVID. She states she is not worried about her symptoms- they are very minor. Advised per protocol:  Criteria for self-isolation if you test positive for COVID-19, regardless of vaccination status:  -If you have mild symptoms that are resolving or have resolved, isolate at home for 5 days since symptoms started AND continue to wear a well-fitted mask when around others in the home and in public for 5 additional days after isolation is completed -If you have a fever and/or moderate to severe symptoms, isolate for at least 10 days since the symptoms started AND until you are fever free for at least 24 hours without the use of fever-reducing medications -If you tested positive and did not have symptoms, isolate for at least 5 days after your positive test  Use over-the-counter medications for symptoms.If you develop respiratory issues/distress, seek medical care in the Emergency Department.  If you must leave home or if you have to be around others please wear a mask.  Please limit contact with immediate family members in the home, practice social distancing, frequent handwashing and clean hard surfaces touched frequently with household cleaning products. Members of your household will also need to quarantine and test.

## 2021-01-16 ENCOUNTER — Telehealth: Payer: Self-pay

## 2021-01-16 NOTE — Telephone Encounter (Signed)
Please see Nurse triage from [facility] dated 01/16/2021.  Can not route this message due to access level.  Please advise if Dr. Neva Seat would be able to see patient as TOC and able to prescribe patients Adderall?

## 2021-01-16 NOTE — Telephone Encounter (Signed)
Call to [facility] office- alerted them to message- they will review and follow up.

## 2021-01-17 NOTE — Telephone Encounter (Signed)
Sure.  I will see her and we can discuss medication and refills,as well as concern from behavioral health. I should be able to refill Adderall without any problems and can discuss previous testing or possible referral for testing if that is needed. Again I am happy to see her and refill medications for now.  Thanks.

## 2021-01-19 NOTE — Telephone Encounter (Signed)
Dr Neva Seat has okd seeing pt for now to refill adderall

## 2021-01-23 NOTE — Telephone Encounter (Signed)
I'm returning this message back to your work queue to avoid miscommunication at your current office. This landed in [place]'s basket.  Thank you! 

## 2021-01-23 NOTE — Telephone Encounter (Signed)
Medication refill appointment

## 2021-01-24 NOTE — Telephone Encounter (Signed)
I have schedule pt for an appt on 01/26/21 [facility]

## 2021-01-26 ENCOUNTER — Ambulatory Visit (INDEPENDENT_AMBULATORY_CARE_PROVIDER_SITE_OTHER): Payer: Self-pay | Admitting: Family Medicine

## 2021-01-26 ENCOUNTER — Other Ambulatory Visit: Payer: Self-pay

## 2021-01-26 VITALS — BP 116/68 | HR 87 | Temp 98.2°F | Resp 16 | Ht 72.0 in | Wt 194.8 lb

## 2021-01-26 DIAGNOSIS — F902 Attention-deficit hyperactivity disorder, combined type: Secondary | ICD-10-CM

## 2021-01-26 DIAGNOSIS — F439 Reaction to severe stress, unspecified: Secondary | ICD-10-CM

## 2021-01-26 DIAGNOSIS — F4323 Adjustment disorder with mixed anxiety and depressed mood: Secondary | ICD-10-CM

## 2021-01-26 MED ORDER — AMPHETAMINE-DEXTROAMPHETAMINE 20 MG PO TABS: 20.0000 mg | ORAL_TABLET | Freq: Two times a day (BID) | ORAL | 0 refills | Status: DC

## 2021-01-26 MED ORDER — ESCITALOPRAM OXALATE 20 MG PO TABS: 20.0000 mg | ORAL_TABLET | Freq: Every day | ORAL | 3 refills | Status: DC

## 2021-01-26 NOTE — Progress Notes (Incomplete)
Subjective:  Patient ID: Samantha Parker, female    DOB: [birth year]  Age: 50 y.o. MRN: [id number]  CC:  Chief Complaint  Patient presents with  . Establish Care    Pt here to establish care and is requesting a medication refill   . ADHD    Pt reports has not had her adderall since beginning April late march. Notes she has had a lot of issues focusing and feels erratic in general since she has been unable to have her adderall    . Depression    Pt struggling with depression anxiety higher than normal today PHQ score 9 GAD7 score 15. Pt very tearful for questionnaire.     HPI Samantha Parker presents for  New patient to establish care, with primary reason of visit medication refills as above.  ADHD: Initially diagnosed by Jens Som at [facility] [place].  Office visit 07/21/2019 noted.  Has been treated with Lexapro for depression.  Some anxiety with difficulty with concentration, forgetfulness, losing objects, to the point where it was affecting her professional life.  She was started on Adderall 10 mg BID for attention and concentration deficit.  Continued Lexapro 20 mg daily.  She was also referred for neurocognitive testing. Follow-up visit November 2020, improved symptoms on Adderall. Dosage increased on 11/06/2019 visit, as she felt she was developing tolerance, and had taken up to 30 mg daily.  Dosage was increased to 20 mg twice daily. Visit May 10, 2020 with Dr. Pamella Pert, mood was stable, focus and concentration was improved on Adderall 20 mg twice daily.  With improved mood trial off Lexapro/taper discussed.   [facility]/urine drug screen on 05/10/2020, no drugs detected.  Controlled substance database (PDMP) reviewed. No concerns appreciated.  Last prescription filled 11/14/2020 for Adderall 20 mg twice daily #60.  She has been out of medication since that time with transition of practices. She was seen at [facility] April 12.  That practice was  unable to prescribe ADHD medication.  Recommended behavioral health eval.  Behavioral health evaluation noted on 01/04/2021.  Need for PCP follow-up discussed. Feels like her focus and attention symptoms were well controlled on Adderall. No insomnia, appetite suppression or palpitations.  No IDU, no CBD.  Alcohol: 0-few drinks per week.   Depression: Has continued Lexapro - did not taper after visit with Dr. Pamella Pert.  Prior therapy with bad breakup - no recent visit.  running low on Lexapro, ran out of meds over past month - off past month.  Felt better on Lexapro. Off meds - irritable, more tearful.  2 close friends are dying of cancer - Glioblastoma, other friend with stage 4 colorectal cancer. Past few months have been difficult.  Therapist: Bartolo Darter - last met a few months ago. Declines need for counseling at this time.   Depression screen [facility] 2/9 01/26/2021 11/14/2020 08/11/2020 05/10/2020 11/06/2019  Decreased Interest 0 0 0 0 1  Down, Depressed, Hopeless 1 0 0 0 1  PHQ - 2 Score 1 0 0 0 2  Altered sleeping 1 0 - - 1  Tired, decreased energy 2 0 - - 1  Change in appetite 1 0 - - 1  Feeling bad or failure about yourself  1 0 - - 0  Trouble concentrating 1 0 - - 1  Moving slowly or fidgety/restless 1 0 - - 0  Suicidal thoughts 0 0 - - 0  PHQ-9 Score 8 0 - - 6  Difficult doing  work/chores Somewhat difficult Not difficult at all - - Somewhat difficult   GAD 7 : Generalized Anxiety Score 01/26/2021 05/10/2020 11/06/2019 07/15/2018  Nervous, Anxious, on Edge 2 0 1 2  Control/stop worrying 2 0 1 2  Worry too much - different things 2 0 1 1  Trouble relaxing 2 0 0 1  Restless 2 0 0 3  Easily annoyed or irritable 3 0 0 3  Afraid - awful might happen 2 0 0 1  Total GAD 7 Score 15 0 3 13  Anxiety Difficulty - Not difficult at all Not difficult at all -      History Patient Active Problem List   Diagnosis Date Noted  . History of colonic polyps 05/10/2020  . Attention deficit  hyperactivity disorder (ADHD), combined type 11/06/2019  . 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 Diverticulosis of colon 10/07/2017   Past Medical History:  Diagnosis Date  . Attention and concentration deficit 07/21/2019  . Contact lens/glasses fitting   . Depression   . Forgetfulness 07/21/2019  . Gallstones   . 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 No pertinent past medical history    Past Surgical History:  Procedure Laterality Date  . CHOLECYSTECTOMY  05/08/2012   Procedure: LAPAROSCOPIC CHOLECYSTECTOMY WITH INTRAOPERATIVE CHOLANGIOGRAM;  Surgeon: Gwenyth Ober, MD;  Location: [place];  Service: General;  Laterality: N/A;  . ENDOMETRIAL ABLATION  2000   Allergies  Allergen Reactions  . Penicillins Nausea Only and Rash    Rash will spread all over the body.   Prior to Admission medications   Medication Sig Start Date End Date Taking? Authorizing Provider  escitalopram (LEXAPRO) 20 MG tablet Take 1 tablet (20 mg total) by mouth daily. 11/14/20  Yes Just, Laurita Quint, FNP  amphetamine-dextroamphetamine (ADDERALL) 20 MG tablet Take 1 tablet (20 mg total) by mouth 2 (two) times daily. 11/14/20 12/14/20  Just, Laurita Quint, FNP   Social History   Socioeconomic History  . Marital status: Single    Spouse name: Not on file  . Number of children: 0  . Years of education: Not on file  . Highest education level: Not on file  Occupational History  . Not on file  Tobacco Use  . Smoking status: Passive Smoke Exposure - Never Smoker  . Smokeless tobacco: Never Used  Vaping Use  . Vaping Use: Never used  Substance and Sexual Activity  . Alcohol use: Yes    Alcohol/week: 3.0 standard drinks    Types: 2 Glasses of wine, 1 Shots of liquor per week    Comment: occ  . Drug use: No  . Sexual activity: Not Currently  Other Topics Concern  . Not on file  Social History Narrative  . Not on file   Social Determinants of Health   Financial  Resource Strain: Not on file  Food Insecurity: Not on file  Transportation Needs: Not on file  Physical Activity: Not on file  Stress: Not on file  Social Connections: Not on file  Intimate Partner Violence: Not on file    Review of Systems   Objective:   Vitals:   01/26/21 0956  BP: 116/68  Pulse: 87  Resp: 16  Temp: 98.2 F (36.8 C)  TempSrc: Temporal  SpO2: 96%  Weight: 194 lb 12.8 oz (88.4 kg)  Height: 6' (1.829 m)     Physical Exam     Assessment & Plan:  Samantha Parker is a 50 y.o. female .  Attention deficit hyperactivity disorder (ADHD), combined type   No orders of the defined types were placed in this encounter.  There are no Patient Instructions on file for this visit.    Signed, Merri Ray, MD [facility]

## 2021-01-26 NOTE — Patient Instructions (Signed)
Restart Adderall at previous dose of 20 mg twice per day.  Restart Lexapro 20 mg each day.  Send me an update by MyChart in the next 2 to 3 weeks but in office follow-up in 6 weeks.  See information below on managing stress and depression but I would also consider following up with your therapist.  Please let me know if there are questions.  Textbook of family medicine (9th ed., pp. 6256-3893). [place], [place]: [employer].">  Stress, Adult Stress is a normal reaction to life events. Stress is what you feel when life demands more than you are used to, or more than you think you can handle. Some stress can be useful, such as studying for a test or meeting a deadline at work. Stress that occurs too often or for too long can cause problems. It can affect your emotional health and interfere with relationships and normal daily activities. Too much stress can weaken your body's defense system (immune system) and increase your risk for physical illness. If you already have a medical problem, stress can make it worse. What are the causes? All sorts of life events can cause stress. An event that causes stress for one person may not be stressful for another person. Major life events, whether positive or negative, commonly cause stress. Examples include:  Losing a job or starting a new job.  Losing a loved one.  Moving to a new town or home.  Getting married or divorced.  Having a baby.  Getting injured or sick. Less obvious life events can also cause stress, especially if they occur day after day or in combination with each other. Examples include:  Working long hours.  Driving in traffic.  Caring for children.  Being in debt.  Being in a difficult relationship. What are the signs or symptoms? Stress can cause emotional symptoms, including:  Anxiety. This is feeling worried, afraid, on edge, overwhelmed, or out of control.  Anger, including irritation or impatience.  Depression. This is  feeling sad, down, helpless, or guilty.  Trouble focusing, remembering, or making decisions. Stress can cause physical symptoms, including:  Aches and pains. These may affect your head, neck, back, stomach, or other areas of your body.  Tight muscles or a clenched jaw.  Low energy.  Trouble sleeping. Stress can cause unhealthy behaviors, including:  Eating to feel better (overeating) or skipping meals.  Working too much or putting off tasks.  Smoking, drinking alcohol, or using drugs to feel better. How is this diagnosed? Stress is diagnosed through an assessment by your health care provider. He or she may diagnose this condition based on:  Your symptoms and any stressful life events.  Your medical history.  Tests to rule out other causes of your symptoms. Depending on your condition, your health care provider may refer you to a specialist for further evaluation. How is this treated? Stress management techniques are the recommended treatment for stress. Medicine is not typically recommended for the treatment of stress. Techniques to reduce your reaction to stressful life events include:  Stress identification. Monitor yourself for symptoms of stress and identify what causes stress for you. These skills may help you to avoid or prepare for stressful events.  Time management. Set your priorities, keep a calendar of events, and learn to say no. Taking these actions can help you avoid making too many commitments. Techniques for coping with stress include:  Rethinking the problem. Try to think realistically about stressful events rather than ignoring them or overreacting. Try  to find the positives in a stressful situation rather than focusing on the negatives.  Exercise. Physical exercise can release both physical and emotional tension. The key is to find a form of exercise that you enjoy and do it regularly.  Relaxation techniques. These relax the body and mind. The key is to  find one or more that you enjoy and use the techniques regularly. Examples include: ? Meditation, deep breathing, or progressive relaxation techniques. ? Yoga or tai chi. ? Biofeedback, mindfulness techniques, or journaling. ? Listening to music, being out in nature, or participating in other hobbies.  Practicing a healthy lifestyle. Eat a balanced diet, drink plenty of water, limit or avoid caffeine, and get plenty of sleep.  Having a strong support network. Spend time with family, friends, or other people you enjoy being around. Express your feelings and talk things over with someone you trust. Counseling or talk therapy with a mental health professional may be helpful if you are having trouble managing stress on your own.   Follow these instructions at home: Lifestyle  Avoid drugs.  Do not use any products that contain nicotine or tobacco, such as cigarettes, e-cigarettes, and chewing tobacco. If you need help quitting, ask your health care provider.  Limit alcohol intake to no more than 1 drink a day for nonpregnant women and 2 drinks a day for men. One drink equals 12 oz of beer, 5 oz of wine, or 1 oz of hard liquor  Do not use alcohol or drugs to relax.  Eat a balanced diet that includes fresh fruits and vegetables, whole grains, lean meats, fish, eggs, and beans, and low-fat dairy. Avoid processed foods and foods high in added fat, sugar, and salt.  Exercise at least 30 minutes on 5 or more days each week.  Get 7-8 hours of sleep each night.   General instructions  Practice stress management techniques as discussed with your health care provider.  Drink enough fluid to keep your urine clear or pale yellow.  Take over-the-counter and prescription medicines only as told by your health care provider.  Keep all follow-up visits as told by your health care provider. This is important.   Contact a health care provider if:  Your symptoms get worse.  You have new  symptoms.  You feel overwhelmed by your problems and can no longer manage them on your own. Get help right away if:  You have thoughts of hurting yourself or others. If you ever feel like you may hurt yourself or others, or have thoughts about taking your own life, get help right away. You can go to your nearest emergency department or call:  Your local emergency services (911 in the [place]  A suicide crisis helpline, such as the [place] at (580) 811-1039. This is open 24 hours a day. Summary  Stress is a normal reaction to life events. It can cause problems if it happens too often or for too long.  Practicing stress management techniques is the best way to treat stress.  Counseling or talk therapy with a mental health professional may be helpful if you are having trouble managing stress on your own. This information is not intended to replace advice given to you by your health care provider. Make sure you discuss any questions you have with your health care provider. Document Revised: 05/27/2020 Document Reviewed: 05/27/2020 Elsevier Patient Education  2021 [place].  http://APA.org/depression-guideline"> https://clinicalkey.com"> http://point-of-care.elsevierperformancemanager.com/skills/"> http://point-of-care.elsevierperformancemanager.com">  Managing Depression, Adult Depression is a mental health condition  that affects your thoughts, feelings, and actions. Being diagnosed with depression can bring you relief if you did not know why you have felt or behaved a certain way. It could also leave you feeling overwhelmed with uncertainty about your future. Preparing yourself to manage your symptoms can help you feel more positive about your future. How to manage lifestyle changes Managing stress Stress is your body's reaction to life changes and events, both good and bad. Stress can add to your feelings of depression. Learning to manage your stress can help  lessen your feelings of depression. Try some of the following approaches to reducing your stress (stress reduction techniques):  Listen to music that you enjoy and that inspires you.  Try using a meditation app or take a meditation class.  Develop a practice that helps you connect with your spiritual self. Walk in nature, pray, or go to a place of worship.  Do some deep breathing. To do this, inhale slowly through your nose. Pause at the top of your inhale for a few seconds and then exhale slowly, letting your muscles relax.  Practice yoga to help relax and work your muscles. Choose a stress reduction technique that suits your lifestyle and personality. These techniques take time and practice to develop. Set aside 5-15 minutes a day to do them. Therapists can offer training in these techniques. Other things you can do to manage stress include:  Keeping a stress diary.  Knowing your limits and saying no when you think something is too much.  Paying attention to how you react to certain situations. You may not be able to control everything, but you can change your reaction.  Adding humor to your life by watching funny films or TV shows.  Making time for activities that you enjoy and that relax you.   Medicines Medicines, such as antidepressants, are often a part of treatment for depression.  Talk with your pharmacist or health care provider about all the medicines, supplements, and herbal products that you take, their possible side effects, and what medicines and other products are safe to take together.  Make sure to report any side effects you may have to your health care provider. Relationships Your health care provider may suggest family therapy, couples therapy, or individual therapy as part of your treatment. How to recognize changes Everyone responds differently to treatment for depression. As you recover from depression, you may start to:  Have more interest in doing  activities.  Feel less hopeless.  Have more energy.  Overeat less often, or have a better appetite.  Have better mental focus. It is important to recognize if your depression is not getting better or is getting worse. The symptoms you had in the beginning may return, such as:  Tiredness (fatigue) or low energy.  Eating too much or too little.  Sleeping too much or too little.  Feeling restless, agitated, or hopeless.  Trouble focusing or making decisions.  Unexplained physical complaints.  Feeling irritable, angry, or aggressive. If you or your family members notice these symptoms coming back, let your health care provider know right away. Follow these instructions at home: Activity  Try to get some form of exercise each day, such as walking, biking, swimming, or lifting weights.  Practice stress reduction techniques.  Engage your mind by taking a class or doing some volunteer work.   Lifestyle  Get the right amount and quality of sleep.  Cut down on using caffeine, tobacco, alcohol, and other potentially harmful substances.  Eat a healthy diet that includes plenty of vegetables, fruits, whole grains, low-fat dairy products, and lean protein. Do not eat a lot of foods that are high in solid fats, added sugars, or salt (sodium). General instructions  Take over-the-counter and prescription medicines only as told by your health care provider.  Keep all follow-up visits as told by your health care provider. This is important. Where to find support Talking to others Friends and family members can be sources of support and guidance. Talk to trusted friends or family members about your condition. Explain your symptoms to them, and let them know that you are working with a health care provider to treat your depression. Tell friends and family members how they also can be helpful.   Finances  Find appropriate mental health providers that fit with your financial  situation.  Talk with your health care provider about options to get reduced prices on your medicines. Where to find more information You can find support in your area from:  Anxiety and Depression Association of America (ADAA): www.adaa.org  Mental Health America: www.mentalhealthamerica.net  [employer] on Mental Illness: www.nami.org Contact a health care provider if:  You stop taking your antidepressant medicines, and you have any of these symptoms: ? Nausea. ? Headache. ? Light-headedness. ? Chills and body aches. ? Not being able to sleep (insomnia).  You or your friends and family think your depression is getting worse. Get help right away if:  You have thoughts of hurting yourself or others. If you ever feel like you may hurt yourself or others, or have thoughts about taking your own life, get help right away. Go to your nearest emergency department or:  Call your local emergency services (911 in the [place]  Call a suicide crisis helpline, such as the [place] at 905-377-3839. This is open 24 hours a day in the [place]  Text the Crisis Text Line at 269 586 5568 (in the [place].). Summary  If you are diagnosed with depression, preparing yourself to manage your symptoms is a good way to feel positive about your future.  Work with your health care provider on a management plan that includes stress reduction techniques, medicines (if applicable), therapy, and healthy lifestyle habits.  Keep talking with your health care provider about how your treatment is working.  If you have thoughts about taking your own life, call a suicide crisis helpline or text a crisis text line. This information is not intended to replace advice given to you by your health care provider. Make sure you discuss any questions you have with your health care provider. Document Revised: 07/22/2019 Document Reviewed: 07/22/2019 Elsevier Patient Education  2021 [employer].

## 2021-01-31 NOTE — Progress Notes (Signed)
Subjective:  Patient ID: Samantha Parker, female    DOB: 08/19/1971  Age: 50 y.o. MRN: [id number]  CC:  Chief Complaint  Patient presents with  . Establish Care    Pt here to establish care and is requesting a medication refill   . ADHD    Pt reports has not had her adderall since beginning April late march. Notes she has had a lot of issues focusing and feels erratic in general since she has been unable to have her adderall    . Depression    Pt struggling with depression anxiety higher than normal today PHQ score 9 GAD7 score 15. Pt very tearful for questionnaire.     HPI EURYDICE CALIXTO presents for   HPI CORENA TILSON presents for  New patient to establish care, with primary reason of visit medication refills as above.  ADHD: Initially diagnosed by Jens Som at [facility] at [place].  Office visit 07/21/2019 noted.  Has been treated with Lexapro for depression.  Some anxiety with difficulty with concentration, forgetfulness, losing objects, to the point where it was affecting her professional life.  She was started on Adderall 10 mg BID for attention and concentration deficit.  Continued Lexapro 20 mg daily.  She was also referred for neurocognitive testing. Follow-up visit November 2020, improved symptoms on Adderall. Dosage increased on 11/06/2019 visit, as she felt she was developing tolerance, and had taken up to 30 mg daily.  Dosage was increased to 20 mg twice daily. Visit May 10, 2020 with Dr. Pamella Pert, mood was stable, focus and concentration was improved on Adderall 20 mg twice daily.  With improved mood trial off Lexapro/taper discussed.   [facility]/urine drug screen on 05/10/2020, no drugs detected.  Controlled substance database (PDMP) reviewed. No concerns appreciated.  Last prescription filled 11/14/2020 for Adderall 20 mg twice daily #60.  She has been out of medication since that time with transition of practices. She was seen at [facility] April 12.  That practice was unable to prescribe ADHD medication.  Recommended behavioral health eval.  Behavioral health evaluation noted on 01/04/2021.  Need for PCP follow-up discussed. Feels like her focus and attention symptoms were well controlled on Adderall. No insomnia, appetite suppression or palpitations.  No IDU, no CBD.  Alcohol: 0-few drinks per week.   Depression: Has continued Lexapro - did not taper after visit with Dr. Pamella Pert.  Prior therapy with bad breakup - no recent visit.  running low on Lexapro, ran out of meds over past month - off past month.  Felt better on Lexapro. Off meds - irritable, more tearful.  2 close friends are dying of cancer - Glioblastoma, other friend with stage 4 colorectal cancer. Past few months have been difficult.  Therapist: Bartolo Darter - last met a few months ago. Declines need for counseling at this time.   Depression screen [facility] 2/9 01/26/2021 11/14/2020 08/11/2020 05/10/2020 11/06/2019  Decreased Interest 0 0 0 0 1  Down, Depressed, Hopeless 1 0 0 0 1  PHQ - 2 Score 1 0 0 0 2  Altered sleeping 1 0 - - 1  Tired, decreased energy 2 0 - - 1  Change in appetite 1 0 - - 1  Feeling bad or failure about yourself  1 0 - - 0  Trouble concentrating 1 0 - - 1  Moving slowly or fidgety/restless 1 0 - - 0  Suicidal thoughts 0 0 - - 0  PHQ-9 Score  8 0 - - 6  Difficult doing work/chores Somewhat difficult Not difficult at all - - Somewhat difficult   GAD 7 : Generalized Anxiety Score 01/26/2021 05/10/2020 11/06/2019 07/15/2018  Nervous, Anxious, on Edge 2 0 1 2  Control/stop worrying 2 0 1 2  Worry too much - different things 2 0 1 1  Trouble relaxing 2 0 0 1  Restless 2 0 0 3  Easily annoyed or irritable 3 0 0 3  Afraid - awful might happen 2 0 0 1  Total GAD 7 Score 15 0 3 13  Anxiety Difficulty - Not difficult at all Not difficult at all -     History Patient Active Problem List   Diagnosis Date Noted  . History of colonic  polyps 05/10/2020  . Attention deficit hyperactivity disorder (ADHD), combined type 11/06/2019  . 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 Diverticulosis of colon 10/07/2017   Past Medical History:  Diagnosis Date  . Attention and concentration deficit 07/21/2019  . Contact lens/glasses fitting   . Depression   . Forgetfulness 07/21/2019  . Gallstones   . 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 No pertinent past medical history    Past Surgical History:  Procedure Laterality Date  . CHOLECYSTECTOMY  05/08/2012   Procedure: LAPAROSCOPIC CHOLECYSTECTOMY WITH INTRAOPERATIVE CHOLANGIOGRAM;  Surgeon: Gwenyth Ober, MD;  Location: [place];  Service: General;  Laterality: N/A;  . ENDOMETRIAL ABLATION  2000   Allergies  Allergen Reactions  . Penicillins Nausea Only and Rash    Rash will spread all over the body.   Prior to Admission medications   Medication Sig Start Date End Date Taking? Authorizing Provider  amphetamine-dextroamphetamine (ADDERALL) 20 MG tablet Take 1 tablet (20 mg total) by mouth 2 (two) times daily. 01/26/21 02/25/21  Wendie Agreste, MD  escitalopram (LEXAPRO) 20 MG tablet Take 1 tablet (20 mg total) by mouth daily. 01/26/21   Wendie Agreste, MD   Social History   Socioeconomic History  . Marital status: Single    Spouse name: Not on file  . Number of children: 0  . Years of education: Not on file  . Highest education level: Not on file  Occupational History  . Not on file  Tobacco Use  . Smoking status: Passive Smoke Exposure - Never Smoker  . Smokeless tobacco: Never Used  Vaping Use  . Vaping Use: Never used  Substance and Sexual Activity  . Alcohol use: Yes    Alcohol/week: 3.0 standard drinks    Types: 2 Glasses of wine, 1 Shots of liquor per week    Comment: occ  . Drug use: No  . Sexual activity: Not Currently  Other Topics Concern  . Not on file  Social History Narrative  . Not on file   Social  Determinants of Health   Financial Resource Strain: Not on file  Food Insecurity: Not on file  Transportation Needs: Not on file  Physical Activity: Not on file  Stress: Not on file  Social Connections: Not on file  Intimate Partner Violence: Not on file    Review of Systems  Per HPI  Objective:   Vitals:   01/26/21 0956  BP: 116/68  Pulse: 87  Resp: 16  Temp: 98.2 F (36.8 C)  TempSrc: Temporal  SpO2: 96%  Weight: 194 lb 12.8 oz (88.4 kg)  Height: 6' (1.829 m)    Physical Exam Vitals reviewed.  Constitutional:  Appearance: She is well-developed.  HENT:     Head: Normocephalic and atraumatic.  Eyes:     Conjunctiva/sclera: Conjunctivae normal.     Pupils: Pupils are equal, round, and reactive to light.  Neck:     Vascular: No carotid bruit.  Cardiovascular:     Rate and Rhythm: Normal rate and regular rhythm.     Heart sounds: Normal heart sounds.  Pulmonary:     Effort: Pulmonary effort is normal.     Breath sounds: Normal breath sounds.  Abdominal:     Palpations: Abdomen is soft. There is no pulsatile mass.     Tenderness: There is no abdominal tenderness.  Skin:    General: Skin is warm and dry.  Neurological:     Mental Status: She is alert and oriented to person, place, and time.  Psychiatric:        Attention and Perception: Attention and perception normal.        Mood and Affect: Mood normal.        Speech: Speech normal.        Behavior: Behavior normal. Behavior is cooperative.        Thought Content: Thought content normal. Thought content is not paranoid or delusional. Thought content does not include homicidal or suicidal ideation.        Judgment: Judgment normal.     Assessment & Plan:  MAISA BEDINGFIELD is a 50 y.o. female . Situational stress  Attention deficit hyperactivity disorder (ADHD), combined type - Plan: amphetamine-dextroamphetamine (ADDERALL) 20 MG tablet, DISCONTINUED: amphetamine-dextroamphetamine (ADDERALL) 20 MG  tablet  Adjustment disorder with mixed anxiety and depressed mood - Plan: escitalopram (LEXAPRO) 20 MG tablet  Decreased control off meds.  Restart Lexapro 20 mg daily, I agreed to refill Adderall same dose for now as much improved when on medications, denied new side effects/worsened anxiety on Adderall. Stress management discussed, handout given, and follow-up with therapist.  Advised to send me an update in the next few weeks with 6-week follow-up.  Meds ordered this encounter  Medications  . DISCONTD: amphetamine-dextroamphetamine (ADDERALL) 20 MG tablet    Sig: Take 1 tablet (20 mg total) by mouth 2 (two) times daily.    Dispense:  60 tablet    Refill:  0  . escitalopram (LEXAPRO) 20 MG tablet    Sig: Take 1 tablet (20 mg total) by mouth daily.    Dispense:  90 tablet    Refill:  3  . amphetamine-dextroamphetamine (ADDERALL) 20 MG tablet    Sig: Take 1 tablet (20 mg total) by mouth 2 (two) times daily.    Dispense:  60 tablet    Refill:  0    Fill in 30 days.   Patient Instructions    Restart Adderall at previous dose of 20 mg twice per day.  Restart Lexapro 20 mg each day.  Send me an update by MyChart in the next 2 to 3 weeks but in office follow-up in 6 weeks.  See information below on managing stress and depression but I would also consider following up with your therapist.  Please let me know if there are questions.  Textbook of family medicine (9th ed., pp. 2330-0762). [place], [place]: [employer].">  Stress, Adult Stress is a normal reaction to life events. Stress is what you feel when life demands more than you are used to, or more than you think you can handle. Some stress can be useful, such as studying for a test or meeting a deadline  at work. Stress that occurs too often or for too long can cause problems. It can affect your emotional health and interfere with relationships and normal daily activities. Too much stress can weaken your body's defense system (immune system)  and increase your risk for physical illness. If you already have a medical problem, stress can make it worse. What are the causes? All sorts of life events can cause stress. An event that causes stress for one person may not be stressful for another person. Major life events, whether positive or negative, commonly cause stress. Examples include:  Losing a job or starting a new job.  Losing a loved one.  Moving to a new town or home.  Getting married or divorced.  Having a baby.  Getting injured or sick. Less obvious life events can also cause stress, especially if they occur day after day or in combination with each other. Examples include:  Working long hours.  Driving in traffic.  Caring for children.  Being in debt.  Being in a difficult relationship. What are the signs or symptoms? Stress can cause emotional symptoms, including:  Anxiety. This is feeling worried, afraid, on edge, overwhelmed, or out of control.  Anger, including irritation or impatience.  Depression. This is feeling sad, down, helpless, or guilty.  Trouble focusing, remembering, or making decisions. Stress can cause physical symptoms, including:  Aches and pains. These may affect your head, neck, back, stomach, or other areas of your body.  Tight muscles or a clenched jaw.  Low energy.  Trouble sleeping. Stress can cause unhealthy behaviors, including:  Eating to feel better (overeating) or skipping meals.  Working too much or putting off tasks.  Smoking, drinking alcohol, or using drugs to feel better. How is this diagnosed? Stress is diagnosed through an assessment by your health care provider. He or she may diagnose this condition based on:  Your symptoms and any stressful life events.  Your medical history.  Tests to rule out other causes of your symptoms. Depending on your condition, your health care provider may refer you to a specialist for further evaluation. How is this  treated? Stress management techniques are the recommended treatment for stress. Medicine is not typically recommended for the treatment of stress. Techniques to reduce your reaction to stressful life events include:  Stress identification. Monitor yourself for symptoms of stress and identify what causes stress for you. These skills may help you to avoid or prepare for stressful events.  Time management. Set your priorities, keep a calendar of events, and learn to say no. Taking these actions can help you avoid making too many commitments. Techniques for coping with stress include:  Rethinking the problem. Try to think realistically about stressful events rather than ignoring them or overreacting. Try to find the positives in a stressful situation rather than focusing on the negatives.  Exercise. Physical exercise can release both physical and emotional tension. The key is to find a form of exercise that you enjoy and do it regularly.  Relaxation techniques. These relax the body and mind. The key is to find one or more that you enjoy and use the techniques regularly. Examples include: ? Meditation, deep breathing, or progressive relaxation techniques. ? Yoga or tai chi. ? Biofeedback, mindfulness techniques, or journaling. ? Listening to music, being out in nature, or participating in other hobbies.  Practicing a healthy lifestyle. Eat a balanced diet, drink plenty of water, limit or avoid caffeine, and get plenty of sleep.  Having a strong  support network. Spend time with family, friends, or other people you enjoy being around. Express your feelings and talk things over with someone you trust. Counseling or talk therapy with a mental health professional may be helpful if you are having trouble managing stress on your own.   Follow these instructions at home: Lifestyle  Avoid drugs.  Do not use any products that contain nicotine or tobacco, such as cigarettes, e-cigarettes, and chewing  tobacco. If you need help quitting, ask your health care provider.  Limit alcohol intake to no more than 1 drink a day for nonpregnant women and 2 drinks a day for men. One drink equals 12 oz of beer, 5 oz of wine, or 1 oz of hard liquor  Do not use alcohol or drugs to relax.  Eat a balanced diet that includes fresh fruits and vegetables, whole grains, lean meats, fish, eggs, and beans, and low-fat dairy. Avoid processed foods and foods high in added fat, sugar, and salt.  Exercise at least 30 minutes on 5 or more days each week.  Get 7-8 hours of sleep each night.   General instructions  Practice stress management techniques as discussed with your health care provider.  Drink enough fluid to keep your urine clear or pale yellow.  Take over-the-counter and prescription medicines only as told by your health care provider.  Keep all follow-up visits as told by your health care provider. This is important.   Contact a health care provider if:  Your symptoms get worse.  You have new symptoms.  You feel overwhelmed by your problems and can no longer manage them on your own. Get help right away if:  You have thoughts of hurting yourself or others. If you ever feel like you may hurt yourself or others, or have thoughts about taking your own life, get help right away. You can go to your nearest emergency department or call:  Your local emergency services (911 in the [place]  A suicide crisis helpline, such as the [place] at 862-102-8421. This is open 24 hours a day. Summary  Stress is a normal reaction to life events. It can cause problems if it happens too often or for too long.  Practicing stress management techniques is the best way to treat stress.  Counseling or talk therapy with a mental health professional may be helpful if you are having trouble managing stress on your own. This information is not intended to replace advice given to you by your  health care provider. Make sure you discuss any questions you have with your health care provider. Document Revised: 05/27/2020 Document Reviewed: 05/27/2020 Elsevier Patient Education  2021 [place].  http://APA.org/depression-guideline"> https://clinicalkey.com"> http://point-of-care.elsevierperformancemanager.com/skills/"> http://point-of-care.elsevierperformancemanager.com">  Managing Depression, Adult Depression is a mental health condition that affects your thoughts, feelings, and actions. Being diagnosed with depression can bring you relief if you did not know why you have felt or behaved a certain way. It could also leave you feeling overwhelmed with uncertainty about your future. Preparing yourself to manage your symptoms can help you feel more positive about your future. How to manage lifestyle changes Managing stress Stress is your body's reaction to life changes and events, both good and bad. Stress can add to your feelings of depression. Learning to manage your stress can help lessen your feelings of depression. Try some of the following approaches to reducing your stress (stress reduction techniques):  Listen to music that you enjoy and that inspires you.  Try using a meditation  app or take a meditation class.  Develop a practice that helps you connect with your spiritual self. Walk in nature, pray, or go to a place of worship.  Do some deep breathing. To do this, inhale slowly through your nose. Pause at the top of your inhale for a few seconds and then exhale slowly, letting your muscles relax.  Practice yoga to help relax and work your muscles. Choose a stress reduction technique that suits your lifestyle and personality. These techniques take time and practice to develop. Set aside 5-15 minutes a day to do them. Therapists can offer training in these techniques. Other things you can do to manage stress include:  Keeping a stress diary.  Knowing your limits and saying  no when you think something is too much.  Paying attention to how you react to certain situations. You may not be able to control everything, but you can change your reaction.  Adding humor to your life by watching funny films or TV shows.  Making time for activities that you enjoy and that relax you.   Medicines Medicines, such as antidepressants, are often a part of treatment for depression.  Talk with your pharmacist or health care provider about all the medicines, supplements, and herbal products that you take, their possible side effects, and what medicines and other products are safe to take together.  Make sure to report any side effects you may have to your health care provider. Relationships Your health care provider may suggest family therapy, couples therapy, or individual therapy as part of your treatment. How to recognize changes Everyone responds differently to treatment for depression. As you recover from depression, you may start to:  Have more interest in doing activities.  Feel less hopeless.  Have more energy.  Overeat less often, or have a better appetite.  Have better mental focus. It is important to recognize if your depression is not getting better or is getting worse. The symptoms you had in the beginning may return, such as:  Tiredness (fatigue) or low energy.  Eating too much or too little.  Sleeping too much or too little.  Feeling restless, agitated, or hopeless.  Trouble focusing or making decisions.  Unexplained physical complaints.  Feeling irritable, angry, or aggressive. If you or your family members notice these symptoms coming back, let your health care provider know right away. Follow these instructions at home: Activity  Try to get some form of exercise each day, such as walking, biking, swimming, or lifting weights.  Practice stress reduction techniques.  Engage your mind by taking a class or doing some volunteer work.    Lifestyle  Get the right amount and quality of sleep.  Cut down on using caffeine, tobacco, alcohol, and other potentially harmful substances.  Eat a healthy diet that includes plenty of vegetables, fruits, whole grains, low-fat dairy products, and lean protein. Do not eat a lot of foods that are high in solid fats, added sugars, or salt (sodium). General instructions  Take over-the-counter and prescription medicines only as told by your health care provider.  Keep all follow-up visits as told by your health care provider. This is important. Where to find support Talking to others Friends and family members can be sources of support and guidance. Talk to trusted friends or family members about your condition. Explain your symptoms to them, and let them know that you are working with a health care provider to treat your depression. Tell friends and family members how they also  can be helpful.   Finances  Find appropriate mental health providers that fit with your financial situation.  Talk with your health care provider about options to get reduced prices on your medicines. Where to find more information You can find support in your area from:  Anxiety and Depression Association of America (ADAA): www.adaa.org  Mental Health America: www.mentalhealthamerica.net  [employer] on Mental Illness: www.nami.org Contact a health care provider if:  You stop taking your antidepressant medicines, and you have any of these symptoms: ? Nausea. ? Headache. ? Light-headedness. ? Chills and body aches. ? Not being able to sleep (insomnia).  You or your friends and family think your depression is getting worse. Get help right away if:  You have thoughts of hurting yourself or others. If you ever feel like you may hurt yourself or others, or have thoughts about taking your own life, get help right away. Go to your nearest emergency department or:  Call your local emergency services  (911 in the [place]  Call a suicide crisis helpline, such as the [place] at 431-623-3024. This is open 24 hours a day in the [place]  Text the Crisis Text Line at 308 648 1044 (in the [place].). Summary  If you are diagnosed with depression, preparing yourself to manage your symptoms is a good way to feel positive about your future.  Work with your health care provider on a management plan that includes stress reduction techniques, medicines (if applicable), therapy, and healthy lifestyle habits.  Keep talking with your health care provider about how your treatment is working.  If you have thoughts about taking your own life, call a suicide crisis helpline or text a crisis text line. This information is not intended to replace advice given to you by your health care provider. Make sure you discuss any questions you have with your health care provider. Document Revised: 07/22/2019 Document Reviewed: 07/22/2019 Elsevier Patient Education  2021 [place].      Signed, Merri Ray, MD [facility]

## 2021-02-18 ENCOUNTER — Encounter: Payer: Self-pay | Admitting: Family Medicine

## 2021-03-08 ENCOUNTER — Ambulatory Visit: Payer: Self-pay | Admitting: Family Medicine

## 2021-03-30 ENCOUNTER — Ambulatory Visit: Payer: Self-pay | Admitting: Registered Nurse

## 2021-03-31 ENCOUNTER — Ambulatory Visit (INDEPENDENT_AMBULATORY_CARE_PROVIDER_SITE_OTHER): Payer: Self-pay | Admitting: Registered Nurse

## 2021-03-31 ENCOUNTER — Encounter: Payer: Self-pay | Admitting: Registered Nurse

## 2021-03-31 ENCOUNTER — Other Ambulatory Visit: Payer: Self-pay

## 2021-03-31 DIAGNOSIS — F4323 Adjustment disorder with mixed anxiety and depressed mood: Secondary | ICD-10-CM

## 2021-03-31 DIAGNOSIS — F902 Attention-deficit hyperactivity disorder, combined type: Secondary | ICD-10-CM

## 2021-03-31 MED ORDER — ESCITALOPRAM OXALATE 20 MG PO TABS: 20.0000 mg | ORAL_TABLET | Freq: Every day | ORAL | 3 refills | Status: DC

## 2021-03-31 MED ORDER — AMPHETAMINE-DEXTROAMPHETAMINE 20 MG PO TABS: 20.0000 mg | ORAL_TABLET | Freq: Two times a day (BID) | ORAL | 0 refills | Status: DC

## 2021-03-31 NOTE — Progress Notes (Signed)
Established Patient Office Visit  Subjective:  Patient ID: Samantha Parker, female    DOB: 13-Aug-1971  Age: 50 y.o. MRN: [id number]  CC:  Chief Complaint  Patient presents with   Follow-up    Patient states she is here medication refill.    HPI Samantha Parker presents for med refill  ADD Has been on adderall 20mg  PO bid for some time Good effect, has been out for a few weeks at this point More irritable and harder time focusing, more emotional lability PDMP reviewed  Past Medical History:  Diagnosis Date   Attention and concentration deficit 07/21/2019   Contact lens/glasses fitting    Depression    Forgetfulness 07/21/2019   Gallstones    Mixed anxiety and depressive disorder 07/21/2019   No pertinent past medical history     Past Surgical History:  Procedure Laterality Date   CHOLECYSTECTOMY  05/08/2012   Procedure: LAPAROSCOPIC CHOLECYSTECTOMY WITH INTRAOPERATIVE CHOLANGIOGRAM;  Surgeon: 05/10/2012, MD;  Location: [facility];  Service: General;  Laterality: N/A;   ENDOMETRIAL ABLATION  2000    Family History  Problem Relation Age of Onset   Breast cancer Mother 58   Diabetes Father    Hypertension Father    Hyperlipidemia Father    Cancer Maternal Grandmother        breast    Social History   Socioeconomic History   Marital status: Single    Spouse name: Not on file   Number of children: 0   Years of education: Not on file   Highest education level: Not on file  Occupational History   Not on file  Tobacco Use   Smoking status: Never    Passive exposure: Yes   Smokeless tobacco: Never  Vaping Use   Vaping Use: Never used  Substance and Sexual Activity   Alcohol use: Yes    Alcohol/week: 3.0 standard drinks    Types: 2 Glasses of wine, 1 Shots of liquor per week    Comment: occ   Drug use: No   Sexual activity: Not Currently  Other Topics Concern   Not on file  Social History Narrative   Not on file   Social  Determinants of Health   Financial Resource Strain: Not on file  Food Insecurity: Not on file  Transportation Needs: Not on file  Physical Activity: Not on file  Stress: Not on file  Social Connections: Not on file  Intimate Partner Violence: Not on file    Outpatient Medications Prior to Visit  Medication Sig Dispense Refill   escitalopram (LEXAPRO) 20 MG tablet Take 1 tablet (20 mg total) by mouth daily. 90 tablet 3   amphetamine-dextroamphetamine (ADDERALL) 20 MG tablet Take 1 tablet (20 mg total) by mouth 2 (two) times daily. 60 tablet 0   No facility-administered medications prior to visit.    Allergies  Allergen Reactions   Penicillins Nausea Only and Rash    Rash will spread all over the body.    ROS Review of Systems  Constitutional: Negative.   HENT: Negative.    Eyes: Negative.   Respiratory: Negative.    Cardiovascular: Negative.   Gastrointestinal: Negative.   Genitourinary: Negative.   Musculoskeletal: Negative.   Skin: Negative.   Neurological: Negative.   Psychiatric/Behavioral: Negative.    All other systems reviewed and are negative.    Objective:    Physical Exam Vitals and nursing note reviewed.  Constitutional:      General: She  is not in acute distress.    Appearance: Normal appearance. She is normal weight. She is not ill-appearing, toxic-appearing or diaphoretic.  Cardiovascular:     Rate and Rhythm: Normal rate and regular rhythm.     Heart sounds: Normal heart sounds. No murmur heard.   No friction rub. No gallop.  Pulmonary:     Effort: Pulmonary effort is normal. No respiratory distress.     Breath sounds: Normal breath sounds. No stridor. No wheezing, rhonchi or rales.  Chest:     Chest wall: No tenderness.  Skin:    General: Skin is warm and dry.  Neurological:     General: No focal deficit present.     Mental Status: She is alert and oriented to person, place, and time. Mental status is at baseline.  Psychiatric:        Mood  and Affect: Mood normal.        Behavior: Behavior normal.        Thought Content: Thought content normal.        Judgment: Judgment normal.    BP 104/73   Pulse 97   Temp 98.2 F (36.8 C) (Temporal)   Resp 18   Ht 6' (1.829 m)   Wt 191 lb (86.6 kg)   SpO2 99%   BMI 25.90 kg/m  Wt Readings from Last 3 Encounters:  03/31/21 191 lb (86.6 kg)  01/26/21 194 lb 12.8 oz (88.4 kg)  11/14/20 196 lb (88.9 kg)     There are no preventive care reminders to display for this patient.  There are no preventive care reminders to display for this patient.  Lab Results  Component Value Date   TSH 1.880 11/14/2020   Lab Results  Component Value Date   WBC 5.9 11/14/2020   HGB 14.9 11/14/2020   HCT 44.4 11/14/2020   MCV 89 11/14/2020   PLT 387 11/14/2020   Lab Results  Component Value Date   NA 138 11/14/2020   K 4.3 11/14/2020   CO2 22 11/14/2020   GLUCOSE 134 (H) 11/14/2020   BUN 11 11/14/2020   CREATININE 0.71 11/14/2020   BILITOT <0.2 11/14/2020   ALKPHOS 103 11/14/2020   AST 14 11/14/2020   ALT 15 11/14/2020   PROT 6.7 11/14/2020   ALBUMIN 4.0 11/14/2020   CALCIUM 9.2 11/14/2020   Lab Results  Component Value Date   CHOL 177 11/14/2020   Lab Results  Component Value Date   HDL 63 11/14/2020   Lab Results  Component Value Date   LDLCALC 97 11/14/2020   Lab Results  Component Value Date   TRIG 94 11/14/2020   Lab Results  Component Value Date   CHOLHDL 2.8 11/14/2020   Lab Results  Component Value Date   HGBA1C 5.6 11/14/2020      Assessment & Plan:   Problem List Items Addressed This Visit       Other   Attention deficit hyperactivity disorder (ADHD), combined type (Chronic)   Relevant Medications   amphetamine-dextroamphetamine (ADDERALL) 20 MG tablet   amphetamine-dextroamphetamine (ADDERALL) 20 MG tablet (Start on 04/28/2021)   amphetamine-dextroamphetamine (ADDERALL) 20 MG tablet (Start on 05/26/2021)   Other Visit Diagnoses      Adjustment disorder with mixed anxiety and depressed mood       Relevant Medications   escitalopram (LEXAPRO) 20 MG tablet       Meds ordered this encounter  Medications   amphetamine-dextroamphetamine (ADDERALL) 20 MG tablet    Sig: Take  1 tablet (20 mg total) by mouth 2 (two) times daily.    Dispense:  60 tablet    Refill:  0    Order Specific Question:   Supervising Provider    Answer:   Neva Seat, JEFFREY R [[id number]]   escitalopram (LEXAPRO) 20 MG tablet    Sig: Take 1 tablet (20 mg total) by mouth daily.    Dispense:  90 tablet    Refill:  3    Order Specific Question:   Supervising Provider    Answer:   Neva Seat, JEFFREY R [[id number]]   amphetamine-dextroamphetamine (ADDERALL) 20 MG tablet    Sig: Take 1 tablet (20 mg total) by mouth 2 (two) times daily.    Dispense:  60 tablet    Refill:  0    Order Specific Question:   Supervising Provider    Answer:   Neva Seat, JEFFREY R [[id number]]   amphetamine-dextroamphetamine (ADDERALL) 20 MG tablet    Sig: Take 1 tablet (20 mg total) by mouth 2 (two) times daily.    Dispense:  60 tablet    Refill:  0    Order Specific Question:   Supervising Provider    Answer:   Neva Seat, JEFFREY R [[id number]]    Follow-up: Return physical.   PLAN Continue adderall 20mg  PO bid. Reviewed risks of medication PDMP reviewed. No apparent discrepancies in past 2 years. Pt will return for CPE and labs with Dr. at earliest convenience. Discussed that she can have a med check with Dr. Neva Seat in 3 mo if she is unable to get a CPE done in that time. Patient encouraged to call clinic with any questions, comments, or concerns.  Neva Seat, NP

## 2021-03-31 NOTE — Patient Instructions (Signed)
° ° ° °  If you have lab work done today you will be contacted with your lab results within the next 2 weeks.  If you have not heard from us then please contact us. The fastest way to get your results is to register for My Chart. ° ° °IF you received an x-ray today, you will receive an invoice from [facility]. Please contact [facility] at [phone] with questions or concerns regarding your invoice.  ° °IF you received labwork today, you will receive an invoice from [facility]. Please contact [facility] at [phone] with questions or concerns regarding your invoice.  ° °Our billing staff will not be able to assist you with questions regarding bills from these companies. ° °You will be contacted with the lab results as soon as they are available. The fastest way to get your results is to activate your My Chart account. Instructions are located on the last page of this paperwork. If you have not heard from us regarding the results in 2 weeks, please contact this office. °  ° ° ° °

## 2021-05-15 ENCOUNTER — Ambulatory Visit: Payer: 59 | Admitting: Family Medicine

## 2021-07-07 ENCOUNTER — Other Ambulatory Visit: Payer: Self-pay

## 2021-07-07 DIAGNOSIS — F902 Attention-deficit hyperactivity disorder, combined type: Secondary | ICD-10-CM

## 2021-07-07 MED ORDER — AMPHETAMINE-DEXTROAMPHETAMINE 20 MG PO TABS: 20.0000 mg | ORAL_TABLET | Freq: Two times a day (BID) | ORAL | 0 refills | Status: DC

## 2021-07-07 NOTE — Telephone Encounter (Signed)
Refill ordered with request to schedule follow-up visit.  Controlled substance database reviewed with last fill on September 16.

## 2021-07-07 NOTE — Telephone Encounter (Signed)
Requesting:Adderall 20mg  Contract: UDS: Last Visit:03/31/21 Next Visit:10/03/21 Last Refill:05/26/21 60 tabs 0 refills  Please Advise

## 2021-07-07 NOTE — Telephone Encounter (Signed)
Caller name:Reeva Scientist, product/process development callback #:[phone]  Encourage patient to contact the pharmacy for refills or they can request refills through [facility]  LAST APPOINTMENT DATE: 07/22 NEXT APPOINTMENT DATE: Will Schedule 6 months from 07/22  MEDICATION NAME & DOSE:  Is the patient out of medication? no  PHARMACY: amphetamine-dextroamphetamine (ADDERALL) 20 MG tablet [[id number]]   Let patient know to contact pharmacy at the end of the day to make sure medication is ready.  Please notify patient to allow 48-72 hours to process  (CLINICAL TO FILL OR ROUTE PER PROTOCOLS)

## 2021-08-09 ENCOUNTER — Other Ambulatory Visit: Payer: Self-pay

## 2021-08-09 DIAGNOSIS — F902 Attention-deficit hyperactivity disorder, combined type: Secondary | ICD-10-CM

## 2021-08-09 NOTE — Telephone Encounter (Signed)
Patient is requesting a refill of the following medications: Requested Prescriptions   Pending Prescriptions Disp Refills   amphetamine-dextroamphetamine (ADDERALL) 20 MG tablet 60 tablet 0    Sig: Take 1 tablet (20 mg total) by mouth 2 (two) times daily.   amphetamine-dextroamphetamine (ADDERALL) 20 MG tablet 60 tablet 0    Sig: Take 1 tablet (20 mg total) by mouth 2 (two) times daily.   amphetamine-dextroamphetamine (ADDERALL) 20 MG tablet 60 tablet 0    Sig: Take 1 tablet (20 mg total) by mouth 2 (two) times daily.    Date of patient request: 08/09/21 Last office visit: 03/31/21 Date of last refill: 07/07/21 Last refill amount: 60  Follow up time period per chart: 12/22

## 2021-08-09 NOTE — Telephone Encounter (Signed)
Pt saw you last on 03/31/2021 and next OV is with you on 10/03/20

## 2021-08-09 NOTE — Telephone Encounter (Signed)
Defer to Dr. Reece Agar!  Thanks,  Luan Pulling

## 2021-08-10 MED ORDER — AMPHETAMINE-DEXTROAMPHETAMINE 20 MG PO TABS: 20.0000 mg | ORAL_TABLET | Freq: Two times a day (BID) | ORAL | 0 refills | Status: DC

## 2021-08-10 NOTE — Telephone Encounter (Signed)
Controlled substance database reviewed, Adderall last filled 07/08/2021.  She did have appointment with my colleague in July.  I will refill additional 3 months of meds but it appears her follow-up visit in January is with Rich.  We will need to clarify if she plans to follow-up with him as PCP versus me and if with me will need to change the follow up to me so we can discuss medications for further refills.

## 2021-10-02 ENCOUNTER — Encounter: Payer: Self-pay | Admitting: Registered Nurse

## 2021-10-03 ENCOUNTER — Encounter: Payer: Self-pay | Admitting: Registered Nurse

## 2021-10-05 ENCOUNTER — Other Ambulatory Visit: Payer: Self-pay

## 2021-10-05 ENCOUNTER — Encounter: Payer: Self-pay | Admitting: Registered Nurse

## 2021-10-05 ENCOUNTER — Telehealth (INDEPENDENT_AMBULATORY_CARE_PROVIDER_SITE_OTHER): Payer: Self-pay | Admitting: Registered Nurse

## 2021-10-05 DIAGNOSIS — F902 Attention-deficit hyperactivity disorder, combined type: Secondary | ICD-10-CM

## 2021-10-05 DIAGNOSIS — F4323 Adjustment disorder with mixed anxiety and depressed mood: Secondary | ICD-10-CM

## 2021-10-05 MED ORDER — ESCITALOPRAM OXALATE 20 MG PO TABS: 20.0000 mg | ORAL_TABLET | Freq: Every day | ORAL | 3 refills | Status: DC

## 2021-10-05 MED ORDER — AMPHETAMINE-DEXTROAMPHETAMINE 20 MG PO TABS: 20.0000 mg | ORAL_TABLET | Freq: Two times a day (BID) | ORAL | 0 refills | Status: DC

## 2021-10-05 NOTE — Patient Instructions (Signed)
° ° ° °  If you have lab work done today you will be contacted with your lab results within the next 2 weeks.  If you have not heard from us then please contact us. The fastest way to get your results is to register for My Chart. ° ° °IF you received an x-ray today, you will receive an invoice from [facility]. Please contact [facility] at [phone] with questions or concerns regarding your invoice.  ° °IF you received labwork today, you will receive an invoice from [facility]. Please contact [facility] at [phone] with questions or concerns regarding your invoice.  ° °Our billing staff will not be able to assist you with questions regarding bills from these companies. ° °You will be contacted with the lab results as soon as they are available. The fastest way to get your results is to activate your My Chart account. Instructions are located on the last page of this paperwork. If you have not heard from us regarding the results in 2 weeks, please contact this office. °  ° ° ° °

## 2021-10-05 NOTE — Progress Notes (Signed)
Telemedicine Encounter- SOAP NOTE Established Patient  This telephone encounter was conducted with the patient's (or proxy's) verbal consent via audio telecommunications: yes/no: Yes Patient was instructed to have this encounter in a suitably private space; and to only have persons present to whom they give permission to participate. In addition, patient identity was confirmed by use of name plus two identifiers (DOB and address).  I discussed the limitations, risks, security and privacy concerns of performing an evaluation and management service by telephone and the availability of in person appointments. I also discussed with the patient that there may be a patient responsible charge related to this service. The patient expressed understanding and agreed to proceed.  I spent a total of 15 minutes talking with the patient or their proxy.  Patient at home Provider in office  Participants: Jari Sportsman, NP and Patrcia Dolly  Chief Complaint  Patient presents with   Medication Refill    Patient states she needs a medication refill. Pt has no other concerns at this time.    Subjective   Samantha Parker is a 51 y.o. established patient. Telephone visit today for med refill  HPI ADHD Adderall 20mg  po bid  Good effect, no AE. Denies sleep disturbance, palpitations, anxiety.  Anxiety Good control with lexapro 20mg  po qd No concerns Would like to continue.   Presenting for CPE and labs in 1 mo.    Patient Active Problem List   Diagnosis Date Noted   History of colonic polyps 05/10/2020   Attention deficit hyperactivity disorder (ADHD), combined type 11/06/2019   Mixed anxiety and depressive disorder 07/21/2019   Diverticulosis of colon 10/07/2017    Past Medical History:  Diagnosis Date   Attention and concentration deficit 07/21/2019   Contact lens/glasses fitting    Depression    Forgetfulness 07/21/2019   Gallstones    Mixed anxiety and depressive disorder  07/21/2019   No pertinent past medical history     Current Outpatient Medications  Medication Sig Dispense Refill   amphetamine-dextroamphetamine (ADDERALL) 20 MG tablet Take 1 tablet (20 mg total) by mouth 2 (two) times daily. 60 tablet 0   amphetamine-dextroamphetamine (ADDERALL) 20 MG tablet Take 1 tablet (20 mg total) by mouth 2 (two) times daily. 60 tablet 0   amphetamine-dextroamphetamine (ADDERALL) 20 MG tablet Take 1 tablet (20 mg total) by mouth 2 (two) times daily. 60 tablet 0   escitalopram (LEXAPRO) 20 MG tablet Take 1 tablet (20 mg total) by mouth daily. 90 tablet 3   No current facility-administered medications for this visit.    Allergies  Allergen Reactions   Penicillins Nausea Only and Rash    Rash will spread all over the body.    Social History   Socioeconomic History   Marital status: Single    Spouse name: Not on file   Number of children: 0   Years of education: Not on file   Highest education level: Not on file  Occupational History   Not on file  Tobacco Use   Smoking status: Never    Passive exposure: Yes   Smokeless tobacco: Never  Vaping Use   Vaping Use: Never used  Substance and Sexual Activity   Alcohol use: Yes    Alcohol/week: 3.0 standard drinks    Types: 2 Glasses of wine, 1 Shots of liquor per week    Comment: occ   Drug use: No   Sexual activity: Not Currently  Other Topics Concern   Not on  file  Social History Narrative   Not on file   Social Determinants of Health   Financial Resource Strain: Not on file  Food Insecurity: Not on file  Transportation Needs: Not on file  Physical Activity: Not on file  Stress: Not on file  Social Connections: Not on file  Intimate Partner Violence: Not on file    Review of Systems  Constitutional: Negative.   HENT: Negative.    Eyes: Negative.   Respiratory: Negative.    Cardiovascular: Negative.   Gastrointestinal: Negative.   Genitourinary: Negative.   Musculoskeletal: Negative.    Skin: Negative.   Neurological: Negative.   Endo/Heme/Allergies: Negative.   Psychiatric/Behavioral: Negative.    All other systems reviewed and are negative.  Objective   Vitals as reported by the patient: There were no vitals filed for this visit.  Samantha Parker was seen today for medication refill.  Diagnoses and all orders for this visit:  Adjustment disorder with mixed anxiety and depressed mood -     escitalopram (LEXAPRO) 20 MG tablet; Take 1 tablet (20 mg total) by mouth daily.  Attention deficit hyperactivity disorder (ADHD), combined type -     amphetamine-dextroamphetamine (ADDERALL) 20 MG tablet; Take 1 tablet (20 mg total) by mouth 2 (two) times daily. -     amphetamine-dextroamphetamine (ADDERALL) 20 MG tablet; Take 1 tablet (20 mg total) by mouth 2 (two) times daily. -     amphetamine-dextroamphetamine (ADDERALL) 20 MG tablet; Take 1 tablet (20 mg total) by mouth 2 (two) times daily.    PLAN Refill meds x 3 mo Pdmp consulted, no concerns. Pt to present for CPE and labs as scheduled Patient encouraged to call clinic with any questions, comments, or concerns.  I discussed the assessment and treatment plan with the patient. The patient was provided an opportunity to ask questions and all were answered. The patient agreed with the plan and demonstrated an understanding of the instructions.   The patient was advised to call back or seek an in-person evaluation if the symptoms worsen or if the condition fails to improve as anticipated.  I provided 15 minutes of non-face-to-face time during this encounter.  Janeece Agee, NP

## 2021-11-10 ENCOUNTER — Encounter: Payer: Self-pay | Admitting: Registered Nurse

## 2022-01-03 ENCOUNTER — Telehealth: Payer: Self-pay

## 2022-01-03 NOTE — Telephone Encounter (Signed)
Patient called for refill of her adderall. Also r/s her physical that she missed.  ?

## 2022-01-04 NOTE — Telephone Encounter (Signed)
Patient is calling back again to check on the refill for her adderall ?

## 2022-01-05 ENCOUNTER — Other Ambulatory Visit: Payer: Self-pay | Admitting: Registered Nurse

## 2022-01-05 DIAGNOSIS — F902 Attention-deficit hyperactivity disorder, combined type: Secondary | ICD-10-CM

## 2022-01-05 MED ORDER — AMPHETAMINE-DEXTROAMPHETAMINE 20 MG PO TABS: 20.0000 mg | ORAL_TABLET | Freq: Two times a day (BID) | ORAL | 0 refills | Status: DC

## 2022-01-05 MED ORDER — AMPHETAMINE-DEXTROAMPHETAMINE 20 MG PO TABS
20.0000 mg | ORAL_TABLET | Freq: Two times a day (BID) | ORAL | 0 refills | Status: DC
Start: 2022-01-05 — End: 2022-05-03

## 2022-01-05 NOTE — Telephone Encounter (Signed)
Sent Thanks Rich

## 2022-01-17 ENCOUNTER — Encounter: Payer: Self-pay | Admitting: Registered Nurse

## 2022-04-06 ENCOUNTER — Telehealth: Payer: Self-pay | Admitting: Family Medicine

## 2022-04-06 ENCOUNTER — Other Ambulatory Visit: Payer: Self-pay

## 2022-04-06 DIAGNOSIS — F4323 Adjustment disorder with mixed anxiety and depressed mood: Secondary | ICD-10-CM

## 2022-04-06 DIAGNOSIS — F902 Attention-deficit hyperactivity disorder, combined type: Secondary | ICD-10-CM

## 2022-04-06 MED ORDER — ESCITALOPRAM OXALATE 20 MG PO TABS: 20.0000 mg | ORAL_TABLET | Freq: Every day | ORAL | 3 refills | Status: DC

## 2022-04-06 NOTE — Telephone Encounter (Signed)
Encourage patient to contact the pharmacy for refills or they can request refills through [facility]  (Please schedule appointment if patient has not been seen in over a year)    WHAT PHARMACY WOULD THEY LIKE THIS SENT TO: [facility] [phone]  MEDICATION NAME & DOSE: Adderall 20 mg and Lexapro 20 mg  NOTES/COMMENTS FROM PATIENT:      Front office please notify patient: It takes 48-72 hours to process rx refill requests Ask patient to call pharmacy to ensure rx is ready before heading there.

## 2022-04-07 MED ORDER — AMPHETAMINE-DEXTROAMPHETAMINE 20 MG PO TABS: 20.0000 mg | ORAL_TABLET | Freq: Two times a day (BID) | ORAL | 0 refills | Status: DC

## 2022-04-07 NOTE — Telephone Encounter (Signed)
Office visit with Samantha Agee, NP on 10/05/2021.  Continued on Adderall 20 mg twice daily.  54-month follow-up planned, no-show for April 26 appointment.  I do not see another appointment scheduled.  Controlled substance database reviewed.  Medication last filled 03/11/2022.  I will refill for 1 additional month but please call and schedule follow-up appointment.

## 2022-04-09 NOTE — Telephone Encounter (Signed)
Spoke w/ pt; scheduled apptmt 04/25/22 @ 1:20

## 2022-04-25 ENCOUNTER — Ambulatory Visit: Payer: Self-pay | Admitting: Family Medicine

## 2022-05-03 ENCOUNTER — Encounter: Payer: Self-pay | Admitting: Family Medicine

## 2022-05-03 ENCOUNTER — Ambulatory Visit (INDEPENDENT_AMBULATORY_CARE_PROVIDER_SITE_OTHER): Payer: Self-pay | Admitting: Family Medicine

## 2022-05-03 VITALS — BP 128/70 | HR 78 | Temp 98.0°F | Resp 17 | Ht 72.0 in | Wt 200.6 lb

## 2022-05-03 DIAGNOSIS — Z7185 Encounter for immunization safety counseling: Secondary | ICD-10-CM

## 2022-05-03 DIAGNOSIS — F902 Attention-deficit hyperactivity disorder, combined type: Secondary | ICD-10-CM

## 2022-05-03 DIAGNOSIS — F418 Other specified anxiety disorders: Secondary | ICD-10-CM

## 2022-05-03 DIAGNOSIS — Z23 Encounter for immunization: Secondary | ICD-10-CM

## 2022-05-03 MED ORDER — AMPHETAMINE-DEXTROAMPHETAMINE 20 MG PO TABS: 20.0000 mg | ORAL_TABLET | Freq: Two times a day (BID) | ORAL | 0 refills | Status: DC

## 2022-05-03 NOTE — Progress Notes (Signed)
Subjective:  Patient ID: Samantha Parker, female    DOB: 12-Jul-1971  Age: 51 y.o. MRN: [id number]  CC:  Chief Complaint  Patient presents with   ADHD        Depression   Immunizations    Shingles vaccine due     HPI Samantha Parker presents for   ADHD Initially discussed at her visit with me in May of last year.  Had been previous patient of colleague at primary care [place], then patient at [facility], unable to prescribe ADD meds at that location.  Behavioral health evaluation in April of last year, plan for PCP follow-up.  Focus and attention symptoms were well-controlled on Adderall without insomnia, appetite suppression or insomnia.  Followed by Maximiano Coss here with most recent visit in January.  Continued Adderall 20 mg twice daily.  Controlled substance database reviewed today, last filled for #60 on 04/09/2022, previously 6/18, previously 5/19, previously April 14.  No concerns. Adderall 2 times per day working well for focus. No insomnia, no appetite suppression, no heart palpitations.  Some weight gain with menopause, change in eating habits. LMP year and a half ago. Plans to meet with GYN.  Wt Readings from Last 3 Encounters:  05/03/22 200 lb 9.6 oz (91 kg)  03/31/21 191 lb (86.6 kg)  01/26/21 194 lb 12.8 oz (88.4 kg)   Depression with anxiety.  Treated with Lexapro 77m. Working well to manage mood symptoms. Rare missed dose.  Trial in the past off meds, more irritable, tearful.  Previously met with therapist VBartolo Darter Would like to stay on meds for now.  Some stress with parents health issues. Handling things well.      05/03/2022    9:40 AM 10/05/2021   11:22 AM 03/31/2021    2:39 PM 01/26/2021   10:01 AM 11/14/2020   11:37 AM  Depression screen PHQ 2/9  Decreased Interest 0 0 0 0 0  Down, Depressed, Hopeless 0 0 0 1 0  PHQ - 2 Score 0 0 0 1 0  Altered sleeping  0  1 0  Tired, decreased energy  0  2 0  Change in appetite  0  1 0  Feeling  bad or failure about yourself   0  1 0  Trouble concentrating  0  1 0  Moving slowly or fidgety/restless  0  1 0  Suicidal thoughts  0  0 0  PHQ-9 Score  0  8 0  Difficult doing work/chores  Not difficult at all  Somewhat difficult Not difficult at all   HM: Shingrix today.  Covid bivalent booster discussed. She will consider.  Plans on physical soon - will schedule GYN app for pap.  Mammogram scheduled in few weeks.  Plans on bloodwork and hep c screening at physical   History Patient Active Problem List   Diagnosis Date Noted   History of colonic polyps 05/10/2020   Attention deficit hyperactivity disorder (ADHD), combined type 11/06/2019   Mixed anxiety and depressive disorder 07/21/2019   Diverticulosis of colon 10/07/2017   Past Medical History:  Diagnosis Date   Attention and concentration deficit 07/21/2019   Contact lens/glasses fitting    Depression    Forgetfulness 07/21/2019   Gallstones    Mixed anxiety and depressive disorder 07/21/2019   No pertinent past medical history    Past Surgical History:  Procedure Laterality Date   CHOLECYSTECTOMY  05/08/2012   Procedure: LAPAROSCOPIC CHOLECYSTECTOMY WITH INTRAOPERATIVE CHOLANGIOGRAM;  Surgeon:  Gwenyth Ober, MD;  Location: [facility];  Service: General;  Laterality: N/A;   ENDOMETRIAL ABLATION  2000   Allergies  Allergen Reactions   Penicillins Nausea Only and Rash    Rash will spread all over the body.   Prior to Admission medications   Medication Sig Start Date End Date Taking? Authorizing Provider  amphetamine-dextroamphetamine (ADDERALL) 20 MG tablet Take 1 tablet (20 mg total) by mouth 2 (two) times daily. 01/05/22  Yes Maximiano Coss, NP  amphetamine-dextroamphetamine (ADDERALL) 20 MG tablet Take 1 tablet (20 mg total) by mouth 2 (two) times daily. 04/07/22  Yes Wendie Agreste, MD  escitalopram (LEXAPRO) 20 MG tablet Take 1 tablet (20 mg total) by mouth daily. 04/06/22  Yes Wendie Agreste, MD  amphetamine-dextroamphetamine (ADDERALL) 20 MG tablet Take 1 tablet (20 mg total) by mouth 2 (two) times daily. 01/05/22 02/04/22  Maximiano Coss, NP   Social History   Socioeconomic History   Marital status: Single    Spouse name: Not on file   Number of children: 0   Years of education: Not on file   Highest education level: Not on file  Occupational History   Not on file  Tobacco Use   Smoking status: Never    Passive exposure: Yes   Smokeless tobacco: Never  Vaping Use   Vaping Use: Never used  Substance and Sexual Activity   Alcohol use: Yes    Alcohol/week: 3.0 standard drinks of alcohol    Types: 2 Glasses of wine, 1 Shots of liquor per week    Comment: occ   Drug use: No   Sexual activity: Not Currently  Other Topics Concern   Not on file  Social History Narrative   Not on file   Social Determinants of Health   Financial Resource Strain: Low Risk  (07/15/2018)   Overall Financial Resource Strain (CARDIA)    Difficulty of Paying Living Expenses: Not hard at all  Food Insecurity: No Food Insecurity (07/15/2018)   Hunger Vital Sign    Worried About Running Out of Food in the Last Year: Never true    Ran Out of Food in the Last Year: Never true  Transportation Needs: No Transportation Needs (07/15/2018)   PRAPARE - Hydrologist (Medical): No    Lack of Transportation (Non-Medical): No  Physical Activity: Sufficiently Active (07/15/2018)   Exercise Vital Sign    Days of Exercise per Week: 5 days    Minutes of Exercise per Session: 60 min  Stress: Stress Concern Present (07/15/2018)   [place]    Feeling of Stress : To some extent  Social Connections: Moderately Integrated (07/15/2018)   Social Connection and Isolation Panel [NHANES]    Frequency of Communication with Friends and Family: More than three times a week    Frequency of Social Gatherings with  Friends and Family: More than three times a week    Attends Religious Services: More than 4 times per year    Active Member of [employer] or Organizations: Yes    Attends Archivist Meetings: More than 4 times per year    Marital Status: Never married  Intimate Partner Violence: Unknown (07/15/2018)   Humiliation, Afraid, Rape, and Kick questionnaire    Fear of Current or Ex-Partner: Patient refused    Emotionally Abused: Patient refused    Physically Abused: Patient refused    Sexually Abused: Patient refused  Review of Systems   Objective:   Vitals:   05/03/22 0938  BP: 128/70  Pulse: 78  Resp: 17  Temp: 98 F (36.7 C)  TempSrc: Oral  SpO2: 96%  Weight: 200 lb 9.6 oz (91 kg)  Height: 6' (1.829 m)     Physical Exam Vitals reviewed.  Constitutional:      Appearance: Normal appearance. She is well-developed.  HENT:     Head: Normocephalic and atraumatic.  Eyes:     Conjunctiva/sclera: Conjunctivae normal.     Pupils: Pupils are equal, round, and reactive to light.  Neck:     Vascular: No carotid bruit.  Cardiovascular:     Rate and Rhythm: Normal rate and regular rhythm.     Heart sounds: Normal heart sounds.  Pulmonary:     Effort: Pulmonary effort is normal.     Breath sounds: Normal breath sounds.  Abdominal:     Palpations: Abdomen is soft. There is no pulsatile mass.     Tenderness: There is no abdominal tenderness.  Musculoskeletal:     Right lower leg: No edema.     Left lower leg: No edema.  Skin:    General: Skin is warm and dry.  Neurological:     Mental Status: She is alert and oriented to person, place, and time.  Psychiatric:        Mood and Affect: Mood normal.        Behavior: Behavior normal.      31 minutes spent during visit, including chart review, counseling about vaccines, med discussion and assimilation of information, exam, discussion of plan, and chart completion.    Assessment & Plan:  Samantha Parker is a 51 y.o.  female . Attention deficit hyperactivity disorder (ADHD), combined type - Plan: amphetamine-dextroamphetamine (ADDERALL) 20 MG tablet, amphetamine-dextroamphetamine (ADDERALL) 20 MG tablet, amphetamine-dextroamphetamine (ADDERALL) 20 MG tablet  -Well controlled on current regimen, continue same.  Refills ordered for next 3 months, plans on physical during that time.  Need for shingles vaccine - Plan: Varicella-zoster vaccine IM  -Initial dose given, repeat next visit.   Mixed anxiety and depressive disorder  -Stable with Lexapro, decided to remain on same dose given family stressors and working well currently.  Can discuss trial off med in the future if stable symptoms.  Immunization counseling  -COVID-vaccine discussed including bivalent booster and potential benefits. She is still deciding whether she will take that vaccine.   Meds ordered this encounter  Medications   amphetamine-dextroamphetamine (ADDERALL) 20 MG tablet    Sig: Take 1 tablet (20 mg total) by mouth 2 (two) times daily.    Dispense:  60 tablet    Refill:  0    Fill 05/09/22   amphetamine-dextroamphetamine (ADDERALL) 20 MG tablet    Sig: Take 1 tablet (20 mg total) by mouth 2 (two) times daily.    Dispense:  60 tablet    Refill:  0    Fill in 30 days   amphetamine-dextroamphetamine (ADDERALL) 20 MG tablet    Sig: Take 1 tablet (20 mg total) by mouth 2 (two) times daily.    Dispense:  60 tablet    Refill:  0    Fill in 60 days.   Patient Instructions  No med changes today.  Follow-up in the next few months for physical and follow-up with GYN as planned.  We can check some blood work next time including thyroid testing but only minimal change in weight since last year.  Thanks for  coming in today and take care.      Signed,   Merri Ray, MD [facility] 05/03/22 10:15 AM

## 2022-05-03 NOTE — Patient Instructions (Addendum)
No med changes today.  Follow-up in the next few months for physical and follow-up with GYN as planned.  We can check some blood work next time including thyroid testing but only minimal change in weight since last year.  Thanks for coming in today and take care.

## 2022-08-03 ENCOUNTER — Other Ambulatory Visit: Payer: Self-pay

## 2022-08-03 DIAGNOSIS — F902 Attention-deficit hyperactivity disorder, combined type: Secondary | ICD-10-CM

## 2022-08-03 MED ORDER — AMPHETAMINE-DEXTROAMPHETAMINE 20 MG PO TABS: 20.0000 mg | ORAL_TABLET | Freq: Two times a day (BID) | ORAL | 0 refills | Status: DC

## 2022-08-03 NOTE — Telephone Encounter (Signed)
Office visit August 10.  Continue same regimen.  59-month refills with additional 3 months refills.  If stable, then physical at 6 months.Controlled substance database reviewed. #60 on 07/07/2022.  Additional refills sent.

## 2022-08-03 NOTE — Telephone Encounter (Signed)
Patient is requesting a refill of the following medications: Requested Prescriptions   Pending Prescriptions Disp Refills   amphetamine-dextroamphetamine (ADDERALL) 20 MG tablet 60 tablet 0    Sig: Take 1 tablet (20 mg total) by mouth 2 (two) times daily.    Date of patient request: 08/03/22 Last office visit: 05/03/22 Date of last refill: 06/03/22 Last refill amount: 60

## 2022-08-08 ENCOUNTER — Encounter: Payer: Self-pay | Admitting: Family Medicine

## 2022-09-05 ENCOUNTER — Telehealth (INDEPENDENT_AMBULATORY_CARE_PROVIDER_SITE_OTHER): Payer: Self-pay | Admitting: Family Medicine

## 2022-09-05 ENCOUNTER — Encounter: Payer: Self-pay | Admitting: Family Medicine

## 2022-09-05 VITALS — Ht 72.0 in | Wt 195.0 lb

## 2022-09-05 DIAGNOSIS — F902 Attention-deficit hyperactivity disorder, combined type: Secondary | ICD-10-CM

## 2022-09-05 DIAGNOSIS — F418 Other specified anxiety disorders: Secondary | ICD-10-CM

## 2022-09-05 MED ORDER — AMPHETAMINE-DEXTROAMPHETAMINE 20 MG PO TABS: 20.0000 mg | ORAL_TABLET | Freq: Two times a day (BID) | ORAL | 0 refills | Status: DC

## 2022-09-05 NOTE — Patient Instructions (Addendum)
Good talking to you today. I refilled your medication same dose. Can review other health recommendations and labs at physical in January.

## 2022-09-05 NOTE — Progress Notes (Signed)
Virtual Visit via Video Note  I connected with Samantha Parker on 09/05/22 at 4:50 PM by a video enabled telemedicine application and verified that I am speaking with the correct person using two identifiers.  Patient location: home - by self.  My location: office - [place].    I discussed the limitations, risks, security and privacy concerns of performing an evaluation and management service by telephone and the availability of in person appointments. I also discussed with the patient that there may be a patient responsible charge related to this service. The patient expressed understanding and agreed to proceed, consent obtained  Chief complaint:  Chief Complaint  Patient presents with   Medication Refill    Pt wants to get a med reill.    History of Present Illness: Samantha Parker is a 51 y.o. female  ADHD: With anxiety/depression, stable on Lexapro at her last visit in August.  ADHD was stable with Adderall 20 mg twice daily.  Initially planned on physical at 3 months follow-up, sick at that time - rescheduled for January.   Still doing well on current regimen. Working well for focus overall. Menopause related sx's - planning on discussing with GYN.  No new side effects.  Mood stable with lexapro. Controlled substance database (PDMP) reviewed. No concerns appreciated. Last filled #60 on 08/06/22.  No heart palpitations. Hot flushes with menopause. No other insomnia. Sleeps well.  No unexplained wt loss. No appetite changes.       09/05/2022    4:09 PM 05/03/2022    9:40 AM 10/05/2021   11:22 AM 03/31/2021    2:39 PM 01/26/2021   10:01 AM  Depression screen PHQ 2/9  Decreased Interest 0 0 0 0 0  Down, Depressed, Hopeless 0 0 0 0 1  PHQ - 2 Score 0 0 0 0 1  Altered sleeping 0  0  1  Tired, decreased energy 0  0  2  Change in appetite 0  0  1  Feeling bad or failure about yourself  0  0  1  Trouble concentrating 0  0  1  Moving slowly or fidgety/restless 0  0   1  Suicidal thoughts 0  0  0  PHQ-9 Score 0  0  8  Difficult doing work/chores   Not difficult at all  Somewhat difficult      Patient Active Problem List   Diagnosis Date Noted   History of colonic polyps 05/10/2020   Attention deficit hyperactivity disorder (ADHD), combined type 11/06/2019   Mixed anxiety and depressive disorder 07/21/2019   Diverticulosis of colon 10/07/2017   Past Medical History:  Diagnosis Date   Attention and concentration deficit 07/21/2019   Contact lens/glasses fitting    Depression    Forgetfulness 07/21/2019   Gallstones    Mixed anxiety and depressive disorder 07/21/2019   No pertinent past medical history    Past Surgical History:  Procedure Laterality Date   CHOLECYSTECTOMY  05/08/2012   Procedure: LAPAROSCOPIC CHOLECYSTECTOMY WITH INTRAOPERATIVE CHOLANGIOGRAM;  Surgeon: Cherylynn Ridges, MD;  Location: [facility];  Service: General;  Laterality: N/A;   ENDOMETRIAL ABLATION  2000   Allergies  Allergen Reactions   Penicillins Nausea Only and Rash    Rash will spread all over the body.   Prior to Admission medications   Medication Sig Start Date End Date Taking? Authorizing Provider  amphetamine-dextroamphetamine (ADDERALL) 20 MG tablet Take 1 tablet (20 mg total) by mouth 2 (two)  times daily. 08/03/22  Yes Shade Flood, MD  amphetamine-dextroamphetamine (ADDERALL) 20 MG tablet Take 1 tablet (20 mg total) by mouth 2 (two) times daily. 08/03/22  Yes Shade Flood, MD  escitalopram (LEXAPRO) 20 MG tablet Take 1 tablet (20 mg total) by mouth daily. 04/06/22  Yes Shade Flood, MD  amphetamine-dextroamphetamine (ADDERALL) 20 MG tablet Take 1 tablet (20 mg total) by mouth 2 (two) times daily. 08/03/22 09/02/22  Shade Flood, MD   Social History   Socioeconomic History   Marital status: Single    Spouse name: Not on file   Number of children: 0   Years of education: Not on file   Highest education level: Not on file   Occupational History   Not on file  Tobacco Use   Smoking status: Never    Passive exposure: Yes   Smokeless tobacco: Never  Vaping Use   Vaping Use: Never used  Substance and Sexual Activity   Alcohol use: Yes    Alcohol/week: 3.0 standard drinks of alcohol    Types: 2 Glasses of wine, 1 Shots of liquor per week    Comment: occ   Drug use: No   Sexual activity: Not Currently  Other Topics Concern   Not on file  Social History Narrative   Not on file   Social Determinants of Health   Financial Resource Strain: Low Risk  (07/15/2018)   Overall Financial Resource Strain (CARDIA)    Difficulty of Paying Living Expenses: Not hard at all  Food Insecurity: No Food Insecurity (07/15/2018)   Hunger Vital Sign    Worried About Running Out of Food in the Last Year: Never true    Ran Out of Food in the Last Year: Never true  Transportation Needs: No Transportation Needs (07/15/2018)   PRAPARE - Administrator, Civil Service (Medical): No    Lack of Transportation (Non-Medical): No  Physical Activity: Sufficiently Active (07/15/2018)   Exercise Vital Sign    Days of Exercise per Week: 5 days    Minutes of Exercise per Session: 60 min  Stress: Stress Concern Present (07/15/2018)   [employer] of Occupational Health - Occupational Stress Questionnaire    Feeling of Stress : To some extent  Social Connections: Moderately Integrated (07/15/2018)   Social Connection and Isolation Panel [NHANES]    Frequency of Communication with Friends and Family: More than three times a week    Frequency of Social Gatherings with Friends and Family: More than three times a week    Attends Religious Services: More than 4 times per year    Active Member of [employer] or Organizations: Yes    Attends Banker Meetings: More than 4 times per year    Marital Status: Never married  Intimate Partner Violence: Unknown (07/15/2018)   Humiliation, Afraid, Rape, and Kick questionnaire     Fear of Current or Ex-Partner: Patient refused    Emotionally Abused: Patient refused    Physically Abused: Patient refused    Sexually Abused: Patient refused    Observations/Objective: Vitals:   09/05/22 1610  Weight: 195 lb (88.5 kg)  Height: 6' (1.829 m)  Nontoxic appearance on video, normal respiratory effort, normal speech, euthymic mood on audio through phone.  All questions answered with understanding plan expressed  Assessment and Plan: Attention deficit hyperactivity disorder (ADHD), combined type - Plan: amphetamine-dextroamphetamine (ADDERALL) 20 MG tablet, amphetamine-dextroamphetamine (ADDERALL) 20 MG tablet  Mixed anxiety and depressive disorder Mood symptoms stable  on current dose Lexapro, some menopausal symptoms, plans to discuss with her GYN.  Focus overall stable with current dose of Adderall 20 mg twice daily without new side effects, will refill today as well as in 30 days with planned physical in January.  Follow Up Instructions:    I discussed the assessment and treatment plan with the patient. The patient was provided an opportunity to ask questions and all were answered. The patient agreed with the plan and demonstrated an understanding of the instructions.   The patient was advised to call back or seek an in-person evaluation if the symptoms worsen or if the condition fails to improve as anticipated.   Shade Flood, MD

## 2022-10-11 ENCOUNTER — Encounter: Payer: Self-pay | Admitting: Family Medicine

## 2022-10-25 NOTE — Progress Notes (Signed)
This encounter was created in error - please disregard.

## 2022-11-16 ENCOUNTER — Other Ambulatory Visit: Payer: Self-pay | Admitting: Family Medicine

## 2022-11-16 DIAGNOSIS — F902 Attention-deficit hyperactivity disorder, combined type: Secondary | ICD-10-CM

## 2022-11-16 NOTE — Telephone Encounter (Signed)
Encourage patient to contact the pharmacy for refills or they can request refills through [facility]  (Please schedule appointment if patient has not been seen in over a year)  Last ov 09/05/22  WHAT PHARMACY WOULD THEY LIKE THIS SENT TO:  [id number] [address] [place] [place]SE: amphetamine-dextroamphetamine (ADDERALL) 20 MG tablet   NOTES/COMMENTS FROM PATIENT:      [place] please notify patient: It takes 48-72 hours to process rx refill requests Ask patient to call pharmacy to ensure rx is ready before heading there.

## 2022-11-16 NOTE — Telephone Encounter (Signed)
Patient is requesting a refill of the following medications: Requested Prescriptions   Pending Prescriptions Disp Refills   amphetamine-dextroamphetamine (ADDERALL) 20 MG tablet 60 tablet 0    Sig: Take 1 tablet (20 mg total) by mouth 2 (two) times daily.    Date of patient request: 11/16/22 Last office visit: 09/05/22 Date of last refill: 09/05/22 Last refill amount: 60

## 2022-11-16 NOTE — Addendum Note (Signed)
Addended by: Patrcia Dolly on: 11/16/2022 02:30 PM   Modules accepted: Orders

## 2022-11-17 MED ORDER — AMPHETAMINE-DEXTROAMPHETAMINE 20 MG PO TABS: 20.0000 mg | ORAL_TABLET | Freq: Two times a day (BID) | ORAL | 0 refills | Status: DC

## 2022-11-17 NOTE — Addendum Note (Signed)
Addended by: Carlota Raspberry, Bambi Fehnel R on: 11/17/2022 03:00 PM   Modules accepted: Orders

## 2022-11-17 NOTE — Telephone Encounter (Signed)
Medication discussed December 13.  Stable on Adderall 20 mg twice daily.  Controlled substance database reviewed.  #60 last filled on 10/04/2022, refill ordered.

## 2022-11-30 ENCOUNTER — Encounter: Payer: Self-pay | Admitting: Family Medicine

## 2022-12-05 ENCOUNTER — Encounter: Payer: Self-pay | Admitting: Family Medicine

## 2023-01-03 ENCOUNTER — Ambulatory Visit: Payer: 59 | Admitting: Family Medicine

## 2023-01-03 ENCOUNTER — Encounter: Payer: Self-pay | Admitting: Family Medicine

## 2023-01-03 VITALS — BP 124/74 | HR 101 | Temp 98.2°F | Ht 72.0 in | Wt 202.6 lb

## 2023-01-03 DIAGNOSIS — Z0000 Encounter for general adult medical examination without abnormal findings: Secondary | ICD-10-CM

## 2023-01-03 DIAGNOSIS — R635 Abnormal weight gain: Secondary | ICD-10-CM

## 2023-01-03 DIAGNOSIS — F4323 Adjustment disorder with mixed anxiety and depressed mood: Secondary | ICD-10-CM

## 2023-01-03 DIAGNOSIS — Z23 Encounter for immunization: Secondary | ICD-10-CM | POA: Diagnosis not present

## 2023-01-03 DIAGNOSIS — Z1329 Encounter for screening for other suspected endocrine disorder: Secondary | ICD-10-CM | POA: Diagnosis not present

## 2023-01-03 DIAGNOSIS — Z136 Encounter for screening for cardiovascular disorders: Secondary | ICD-10-CM

## 2023-01-03 DIAGNOSIS — Z130 Encounter for screening for diseases of the blood and blood-forming organs and certain disorders involving the immune mechanism: Secondary | ICD-10-CM

## 2023-01-03 DIAGNOSIS — L659 Nonscarring hair loss, unspecified: Secondary | ICD-10-CM

## 2023-01-03 DIAGNOSIS — Z131 Encounter for screening for diabetes mellitus: Secondary | ICD-10-CM | POA: Diagnosis not present

## 2023-01-03 DIAGNOSIS — Z13220 Encounter for screening for lipoid disorders: Secondary | ICD-10-CM | POA: Diagnosis not present

## 2023-01-03 MED ORDER — ESCITALOPRAM OXALATE 20 MG PO TABS: 20.0000 mg | ORAL_TABLET | Freq: Every day | ORAL | 3 refills | Status: DC

## 2023-01-03 NOTE — Progress Notes (Signed)
Subjective:  Patient ID: Samantha Parker, female    DOB: 1971/01/11  Age: 52 y.o. MRN: [id number]  CC:  Chief Complaint  Patient presents with   Annual Exam    HPI [name] presents for Annual Exam No acute issues.  Followed by GYN [name], prior [name] who retired. Plans to discuss menopausal issues with GYN.  Hair falling out, weight gain. Some decreased diet adherence. More sugar craving. Father with type 2 DM.  Wt Readings from Last 3 Encounters:  01/03/23 202 lb 9.6 oz (91.9 kg)  09/05/22 195 lb (88.5 kg)  05/03/22 200 lb 9.6 oz (91 kg)   Lab Results  Component Value Date   TSH 1.880 11/14/2020    Anxiety/depression Treated with Lexapro 20 mg daily, still doing well, even with some possible menopause as well. Last menses few years ago, no further PMS symptoms.     01/03/2023    2:21 PM 09/05/2022    4:09 PM 05/03/2022    9:40 AM 10/05/2021   11:22 AM 03/31/2021    2:39 PM  Depression screen PHQ 2/9  Decreased Interest 0 0 0 0 0  Down, Depressed, Hopeless 0 0 0 0 0  PHQ - 2 Score 0 0 0 0 0  Altered sleeping 0 0  0   Tired, decreased energy 0 0  0   Change in appetite 0 0  0   Feeling bad or failure about yourself  0 0  0   Trouble concentrating 0 0  0   Moving slowly or fidgety/restless 0 0  0   Suicidal thoughts 0 0  0   PHQ-9 Score 0 0  0   Difficult doing work/chores Not difficult at all   Not difficult at all       01/03/2023    2:22 PM 01/26/2021   10:02 AM 05/10/2020    9:49 AM 11/06/2019    8:50 AM  GAD 7 : Generalized Anxiety Score  Nervous, Anxious, on Edge 0 2 0 1  Control/stop worrying 0 2 0 1  Worry too much - different things 0 2 0 1  Trouble relaxing 0 2 0 0  Restless 0 2 0 0  Easily annoyed or irritable 0 3 0 0  Afraid - awful might happen 0 2 0 0  Total GAD 7 Score 0 15 0 3  Anxiety Difficulty Not difficult at all  Not difficult at all Not difficult at all   Attention deficit disorder Treated with Adderall 20 mg twice  daily. Controlled substance database reviewed, last filled 12/19/2022 Still doing well. Working well for focus.  No appetite suppression, no insomnia, CP/palpitations.  Lab Results  Component Value Date   HGBA1C 5.6 11/14/2020     Health Maintenance  Topic Date Due   COVID-19 Vaccine (3 - Pfizer risk series) 12/04/2019   PAP SMEAR-Modifier  10/25/2020   Zoster Vaccines- Shingrix (2 of 2) 06/28/2022   MAMMOGRAM  05/04/2023 (Originally 04/21/2021)   Hepatitis C Screening  05/04/2023 (Originally 05/26/1987)   INFLUENZA VACCINE  04/25/2023   COLONOSCOPY (Pts 45-43yrs Insurance coverage will need to be confirmed)  09/24/2027   DTaP/Tdap/Td (2 - Tdap) 07/15/2028   HIV Screening  Completed   HPV VACCINES  Aged Out  Colonoscopy 09/23/17 - repeat 5 years. Plans to call Dr. Kenna Gilbert office to schedule Mammogram - overdue -will schedule at [facility].  Pap - unknown  - will schedule.   Immunization History  Administered  Date(s) Administered   Influenza Inj Mdck Quad Pf 07/29/2017   Influenza,inj,Quad PF,6+ Mos 07/15/2018, 06/19/2019   Influenza-Unspecified 06/19/2019, 06/13/2020   PFIZER(Purple Top)SARS-COV-2 Vaccination 10/16/2019, 11/06/2019   Td 07/15/2018   Zoster Recombinat (Shingrix) 05/03/2022  2nd shingrix today.  Covid booster in fall recommended Flu vaccine - recommended this fall. Prior at pharmacy.   No results found. Optho - visit in past year.   Dental: every year - overdue - plans to schedule   Alcohol: rare, few per week.   Tobacco: none.   Exercise: moving furniture, plans to increase   History Patient Active Problem List   Diagnosis Date Noted   History of colonic polyps 05/10/2020   Attention deficit hyperactivity disorder (ADHD), combined type 11/06/2019   Mixed anxiety and depressive disorder 07/21/2019   Diverticulosis of colon 10/07/2017   Past Medical History:  Diagnosis Date   Attention and concentration deficit 07/21/2019   Contact lens/glasses  fitting    Depression    Forgetfulness 07/21/2019   Gallstones    Mixed anxiety and depressive disorder 07/21/2019   No pertinent past medical history    Past Surgical History:  Procedure Laterality Date   CHOLECYSTECTOMY  05/08/2012   Procedure: LAPAROSCOPIC CHOLECYSTECTOMY WITH INTRAOPERATIVE CHOLANGIOGRAM;  Surgeon: Cherylynn Ridges, MD;  Location: [facility];  Service: General;  Laterality: N/A;   ENDOMETRIAL ABLATION  2000   Allergies  Allergen Reactions   Penicillins Nausea Only and Rash    Rash will spread all over the body.   Prior to Admission medications   Medication Sig Start Date End Date Taking? Authorizing Provider  amphetamine-dextroamphetamine (ADDERALL) 20 MG tablet Take 1 tablet (20 mg total) by mouth 2 (two) times daily. 09/05/22  Yes Shade Flood, MD  amphetamine-dextroamphetamine (ADDERALL) 20 MG tablet Take 1 tablet (20 mg total) by mouth 2 (two) times daily. 09/05/22  Yes Shade Flood, MD  escitalopram (LEXAPRO) 20 MG tablet Take 1 tablet (20 mg total) by mouth daily. 04/06/22  Yes Shade Flood, MD  amphetamine-dextroamphetamine (ADDERALL) 20 MG tablet Take 1 tablet (20 mg total) by mouth 2 (two) times daily. 11/17/22 12/17/22  Shade Flood, MD   Social History   Socioeconomic History   Marital status: Single    Spouse name: Not on file   Number of children: 0   Years of education: Not on file   Highest education level: Not on file  Occupational History   Not on file  Tobacco Use   Smoking status: Never    Passive exposure: Yes   Smokeless tobacco: Never  Vaping Use   Vaping Use: Never used  Substance and Sexual Activity   Alcohol use: Yes    Alcohol/week: 3.0 standard drinks of alcohol    Types: 2 Glasses of wine, 1 Shots of liquor per week    Comment: occ   Drug use: No   Sexual activity: Yes  Other Topics Concern   Not on file  Social History Narrative   Not on file   Social Determinants of Health   Financial  Resource Strain: Low Risk  (07/15/2018)   Overall Financial Resource Strain (CARDIA)    Difficulty of Paying Living Expenses: Not hard at all  Food Insecurity: No Food Insecurity (07/15/2018)   Hunger Vital Sign    Worried About Running Out of Food in the Last Year: Never true    Ran Out of Food in the Last Year: Never true  Transportation Needs: No Transportation  Needs (07/15/2018)   PRAPARE - Administrator, Civil Service (Medical): No    Lack of Transportation (Non-Medical): No  Physical Activity: Sufficiently Active (07/15/2018)   Exercise Vital Sign    Days of Exercise per Week: 5 days    Minutes of Exercise per Session: 60 min  Stress: Stress Concern Present (07/15/2018)   [employer] of Occupational Health - Occupational Stress Questionnaire    Feeling of Stress : To some extent  Social Connections: Moderately Integrated (07/15/2018)   Social Connection and Isolation Panel [NHANES]    Frequency of Communication with Friends and Family: More than three times a week    Frequency of Social Gatherings with Friends and Family: More than three times a week    Attends Religious Services: More than 4 times per year    Active Member of [employer] or Organizations: Yes    Attends Banker Meetings: More than 4 times per year    Marital Status: Never married  Intimate Partner Violence: Unknown (07/15/2018)   Humiliation, Afraid, Rape, and Kick questionnaire    Fear of Current or Ex-Partner: Patient declined    Emotionally Abused: Patient declined    Physically Abused: Patient declined    Sexually Abused: Patient declined    Review of Systems 13 point review of systems per patient health survey noted.  Negative other than as indicated above or in HPI.  Objective:   Vitals:   01/03/23 1405  BP: 124/74  Pulse: (!) 101  Temp: 98.2 F (36.8 C)  TempSrc: Temporal  SpO2: 96%  Weight: 202 lb 9.6 oz (91.9 kg)  Height: 6' (1.829 m)     Physical  Exam Constitutional:      Appearance: She is well-developed.  HENT:     Head: Normocephalic and atraumatic.     Right Ear: External ear normal.     Left Ear: External ear normal.  Eyes:     Conjunctiva/sclera: Conjunctivae normal.     Pupils: Pupils are equal, round, and reactive to light.  Neck:     Thyroid: No thyromegaly.  Cardiovascular:     Rate and Rhythm: Normal rate and regular rhythm.     Heart sounds: Normal heart sounds. No murmur heard. Pulmonary:     Effort: Pulmonary effort is normal. No respiratory distress.     Breath sounds: Normal breath sounds. No wheezing.  Abdominal:     General: Bowel sounds are normal.     Palpations: Abdomen is soft.     Tenderness: There is no abdominal tenderness.  Musculoskeletal:        General: No tenderness. Normal range of motion.     Cervical back: Normal range of motion and neck supple. No rigidity or tenderness.  Lymphadenopathy:     Cervical: No cervical adenopathy.  Skin:    General: Skin is warm and dry.     Findings: No rash.  Neurological:     Mental Status: She is alert and oriented to person, place, and time.  Psychiatric:        Behavior: Behavior normal.        Thought Content: Thought content normal.     Assessment & Plan:  JENNFIER ABDULLA is a 52 y.o. female . Annual physical exam  - -anticipatory guidance as below in AVS, screening labs above. Health maintenance items as above in HPI discussed/recommended as applicable.   Adjustment disorder with mixed anxiety and depressed mood - Plan: escitalopram (LEXAPRO) 20 MG tablet, TSH  -  Stable with Lexapro, continue same.  ADD symptoms stable on current regimen, okay to refill for 6 months.  Screening for hyperlipidemia - Plan: Lipid panel  Screening for deficiency anemia - Plan: CBC w/Diff  Screening for diabetes mellitus - Plan: Hemoglobin A1c  Screening for hypertension - Plan: Comprehensive metabolic panel  Screening for thyroid disorder - Plan:  TSH Weight gain - Plan: TSH, CBC w/Diff Hair loss - Plan: TSH  -Check TSH, plans to discuss symptoms with her gynecologist for possible treatment and discussion of postmenopausal causes.  Need for shingles vaccine - Plan: Varicella-zoster vaccine IM   Meds ordered this encounter  Medications   escitalopram (LEXAPRO) 20 MG tablet    Sig: Take 1 tablet (20 mg total) by mouth daily.    Dispense:  90 tablet    Refill:  3   Patient Instructions  Watching diet and increased exercise should help with weight and help you feel better. Start low, go slow with intensity but goal of 150 minutes per week as a goal.  If any concerns on labs I will let you know.  No med changes for now.-Pharmacy request refill when due.  Please contact gynecologist to schedule appointment for Pap testing and discussion of menopausal symptoms.  Call gastroenterology to schedule updated colonoscopy.  Let me know if referrals are needed.  Schedule mammogram, let me know if they need a referral.  Schedule dental visit when possible.  Take care and thank you for coming in today.  Preventive Care 73-49 Years Old, Female Preventive care refers to lifestyle choices and visits with your health care provider that can promote health and wellness. Preventive care visits are also called wellness exams. What can I expect for my preventive care visit? Counseling Your health care provider may ask you questions about your: Medical history, including: Past medical problems. Family medical history. Pregnancy history. Current health, including: Menstrual cycle. Method of birth control. Emotional well-being. Home life and relationship well-being. Sexual activity and sexual health. Lifestyle, including: Alcohol, nicotine or tobacco, and drug use. Access to firearms. Diet, exercise, and sleep habits. Work and work Astronomer. Sunscreen use. Safety issues such as seatbelt and bike helmet use. Physical exam Your health  care provider will check your: Height and weight. These may be used to calculate your BMI (body mass index). BMI is a measurement that tells if you are at a healthy weight. Waist circumference. This measures the distance around your waistline. This measurement also tells if you are at a healthy weight and may help predict your risk of certain diseases, such as type 2 diabetes and high blood pressure. Heart rate and blood pressure. Body temperature. Skin for abnormal spots. What immunizations do I need?  Vaccines are usually given at various ages, according to a schedule. Your health care provider will recommend vaccines for you based on your age, medical history, and lifestyle or other factors, such as travel or where you work. What tests do I need? Screening Your health care provider may recommend screening tests for certain conditions. This may include: Lipid and cholesterol levels. Diabetes screening. This is done by checking your blood sugar (glucose) after you have not eaten for a while (fasting). Pelvic exam and Pap test. Hepatitis B test. Hepatitis C test. HIV (human immunodeficiency virus) test. STI (sexually transmitted infection) testing, if you are at risk. Lung cancer screening. Colorectal cancer screening. Mammogram. Talk with your health care provider about when you should start having regular mammograms. This may depend on whether  you have a family history of breast cancer. BRCA-related cancer screening. This may be done if you have a family history of breast, ovarian, tubal, or peritoneal cancers. Bone density scan. This is done to screen for osteoporosis. Talk with your health care provider about your test results, treatment options, and if necessary, the need for more tests. Follow these instructions at home: Eating and drinking  Eat a diet that includes fresh fruits and vegetables, whole grains, lean protein, and low-fat dairy products. Take vitamin and mineral  supplements as recommended by your health care provider. Do not drink alcohol if: Your health care provider tells you not to drink. You are pregnant, may be pregnant, or are planning to become pregnant. If you drink alcohol: Limit how much you have to 0-1 drink a day. Know how much alcohol is in your drink. In the [place] one drink equals one 12 oz bottle of beer (355 mL), one 5 oz glass of wine (148 mL), or one 1 oz glass of hard liquor (44 mL). Lifestyle Brush your teeth every morning and night with fluoride toothpaste. Floss one time each day. Exercise for at least 30 minutes 5 or more days each week. Do not use any products that contain nicotine or tobacco. These products include cigarettes, chewing tobacco, and vaping devices, such as e-cigarettes. If you need help quitting, ask your health care provider. Do not use drugs. If you are sexually active, practice safe sex. Use a condom or other form of protection to prevent STIs. If you do not wish to become pregnant, use a form of birth control. If you plan to become pregnant, see your health care provider for a prepregnancy visit. Take aspirin only as told by your health care provider. Make sure that you understand how much to take and what form to take. Work with your health care provider to find out whether it is safe and beneficial for you to take aspirin daily. Find healthy ways to manage stress, such as: Meditation, yoga, or listening to music. Journaling. Talking to a trusted person. Spending time with friends and family. Minimize exposure to UV radiation to reduce your risk of skin cancer. Safety Always wear your seat belt while driving or riding in a vehicle. Do not drive: If you have been drinking alcohol. Do not ride with someone who has been drinking. When you are tired or distracted. While texting. If you have been using any mind-altering substances or drugs. Wear a helmet and other protective equipment during sports  activities. If you have firearms in your house, make sure you follow all gun safety procedures. Seek help if you have been physically or sexually abused. What's next? Visit your health care provider once a year for an annual wellness visit. Ask your health care provider how often you should have your eyes and teeth checked. Stay up to date on all vaccines. This information is not intended to replace advice given to you by your health care provider. Make sure you discuss any questions you have with your health care provider. Document Revised: 03/08/2021 Document Reviewed: 03/08/2021 Elsevier Patient Education  2023 Elsevier Inc.          Signed,   Meredith StaggersJeffrey Rosabell Geyer, MD [facility], [facility] 01/03/23 3:15 PM

## 2023-01-03 NOTE — Patient Instructions (Addendum)
Watching diet and increased exercise should help with weight and help you feel better. Start low, go slow with intensity but goal of 150 minutes per week as a goal.  If any concerns on labs I will let you know.  No med changes for now.-Pharmacy request refill when due.  Please contact gynecologist to schedule appointment for Pap testing and discussion of menopausal symptoms.  Call gastroenterology to schedule updated colonoscopy.  Let me know if referrals are needed.  Schedule mammogram, let me know if they need a referral.  Schedule dental visit when possible.  Take care and thank you for coming in today.  Preventive Care 49-57 Years Old, Female Preventive care refers to lifestyle choices and visits with your health care provider that can promote health and wellness. Preventive care visits are also called wellness exams. What can I expect for my preventive care visit? Counseling Your health care provider may ask you questions about your: Medical history, including: Past medical problems. Family medical history. Pregnancy history. Current health, including: Menstrual cycle. Method of birth control. Emotional well-being. Home life and relationship well-being. Sexual activity and sexual health. Lifestyle, including: Alcohol, nicotine or tobacco, and drug use. Access to firearms. Diet, exercise, and sleep habits. Work and work Astronomer. Sunscreen use. Safety issues such as seatbelt and bike helmet use. Physical exam Your health care provider will check your: Height and weight. These may be used to calculate your BMI (body mass index). BMI is a measurement that tells if you are at a healthy weight. Waist circumference. This measures the distance around your waistline. This measurement also tells if you are at a healthy weight and may help predict your risk of certain diseases, such as type 2 diabetes and high blood pressure. Heart rate and blood pressure. Body  temperature. Skin for abnormal spots. What immunizations do I need?  Vaccines are usually given at various ages, according to a schedule. Your health care provider will recommend vaccines for you based on your age, medical history, and lifestyle or other factors, such as travel or where you work. What tests do I need? Screening Your health care provider may recommend screening tests for certain conditions. This may include: Lipid and cholesterol levels. Diabetes screening. This is done by checking your blood sugar (glucose) after you have not eaten for a while (fasting). Pelvic exam and Pap test. Hepatitis B test. Hepatitis C test. HIV (human immunodeficiency virus) test. STI (sexually transmitted infection) testing, if you are at risk. Lung cancer screening. Colorectal cancer screening. Mammogram. Talk with your health care provider about when you should start having regular mammograms. This may depend on whether you have a family history of breast cancer. BRCA-related cancer screening. This may be done if you have a family history of breast, ovarian, tubal, or peritoneal cancers. Bone density scan. This is done to screen for osteoporosis. Talk with your health care provider about your test results, treatment options, and if necessary, the need for more tests. Follow these instructions at home: Eating and drinking  Eat a diet that includes fresh fruits and vegetables, whole grains, lean protein, and low-fat dairy products. Take vitamin and mineral supplements as recommended by your health care provider. Do not drink alcohol if: Your health care provider tells you not to drink. You are pregnant, may be pregnant, or are planning to become pregnant. If you drink alcohol: Limit how much you have to 0-1 drink a day. Know how much alcohol is in your drink. In the [place] one  drink equals one 12 oz bottle of beer (355 mL), one 5 oz glass of wine (148 mL), or one 1 oz glass of hard liquor (44  mL). Lifestyle Brush your teeth every morning and night with fluoride toothpaste. Floss one time each day. Exercise for at least 30 minutes 5 or more days each week. Do not use any products that contain nicotine or tobacco. These products include cigarettes, chewing tobacco, and vaping devices, such as e-cigarettes. If you need help quitting, ask your health care provider. Do not use drugs. If you are sexually active, practice safe sex. Use a condom or other form of protection to prevent STIs. If you do not wish to become pregnant, use a form of birth control. If you plan to become pregnant, see your health care provider for a prepregnancy visit. Take aspirin only as told by your health care provider. Make sure that you understand how much to take and what form to take. Work with your health care provider to find out whether it is safe and beneficial for you to take aspirin daily. Find healthy ways to manage stress, such as: Meditation, yoga, or listening to music. Journaling. Talking to a trusted person. Spending time with friends and family. Minimize exposure to UV radiation to reduce your risk of skin cancer. Safety Always wear your seat belt while driving or riding in a vehicle. Do not drive: If you have been drinking alcohol. Do not ride with someone who has been drinking. When you are tired or distracted. While texting. If you have been using any mind-altering substances or drugs. Wear a helmet and other protective equipment during sports activities. If you have firearms in your house, make sure you follow all gun safety procedures. Seek help if you have been physically or sexually abused. What's next? Visit your health care provider once a year for an annual wellness visit. Ask your health care provider how often you should have your eyes and teeth checked. Stay up to date on all vaccines. This information is not intended to replace advice given to you by your health care  provider. Make sure you discuss any questions you have with your health care provider. Document Revised: 03/08/2021 Document Reviewed: 03/08/2021 Elsevier Patient Education  2023 [employer].

## 2023-01-04 LAB — CBC WITH DIFFERENTIAL/PLATELET
Basophils Absolute: 0.1 10*3/uL (ref 0.0–0.1)
Basophils Relative: 0.8 % (ref 0.0–3.0)
Eosinophils Absolute: 0.1 10*3/uL (ref 0.0–0.7)
Eosinophils Relative: 1.6 % (ref 0.0–5.0)
HCT: 42.5 % (ref 36.0–46.0)
Hemoglobin: 14.2 g/dL (ref 12.0–15.0)
Lymphocytes Relative: 26.1 % (ref 12.0–46.0)
Lymphs Abs: 1.9 10*3/uL (ref 0.7–4.0)
MCHC: 33.5 g/dL (ref 30.0–36.0)
MCV: 88.4 fl (ref 78.0–100.0)
Monocytes Absolute: 0.5 10*3/uL (ref 0.1–1.0)
Monocytes Relative: 6.4 % (ref 3.0–12.0)
Neutro Abs: 4.8 10*3/uL (ref 1.4–7.7)
Neutrophils Relative %: 65.1 % (ref 43.0–77.0)
Platelets: 386 10*3/uL (ref 150.0–400.0)
RBC: 4.8 Mil/uL (ref 3.87–5.11)
RDW: 13.3 % (ref 11.5–15.5)
WBC: 7.4 10*3/uL (ref 4.0–10.5)

## 2023-01-04 LAB — COMPREHENSIVE METABOLIC PANEL WITH GFR
ALT: 17 U/L (ref 0–35)
AST: 16 U/L (ref 0–37)
Albumin: 4 g/dL (ref 3.5–5.2)
Alkaline Phosphatase: 99 U/L (ref 39–117)
BUN: 18 mg/dL (ref 6–23)
CO2: 26 meq/L (ref 19–32)
Calcium: 9.3 mg/dL (ref 8.4–10.5)
Chloride: 108 meq/L (ref 96–112)
Creatinine, Ser: 0.8 mg/dL (ref 0.40–1.20)
GFR: 84.01 mL/min (ref >60.00)
Glucose, Bld: 86 mg/dL (ref 70–99)
Potassium: 4.1 meq/L (ref 3.5–5.1)
Sodium: 143 meq/L (ref 135–145)
Total Bilirubin: 0.2 mg/dL (ref 0.2–1.2)
Total Protein: 6.9 g/dL (ref 6.0–8.3)

## 2023-01-04 LAB — HEMOGLOBIN A1C: Hgb A1c MFr Bld: 5.9 % (ref 4.6–6.5)

## 2023-01-04 LAB — LIPID PANEL
Cholesterol: 128 mg/dL (ref 0–200)
HDL: 58.8 mg/dL (ref >39.00)
LDL Cholesterol: 29 mg/dL (ref 0–99)
NonHDL: 68.93
Total CHOL/HDL Ratio: 2
Triglycerides: 198 mg/dL — ABNORMAL HIGH (ref 0.0–149.0)
VLDL: 39.6 mg/dL (ref 0.0–40.0)

## 2023-01-04 LAB — TSH: TSH: 1.46 u[IU]/mL (ref 0.35–5.50)

## 2023-02-06 DIAGNOSIS — R1032 Left lower quadrant pain: Secondary | ICD-10-CM | POA: Insufficient documentation

## 2023-02-06 DIAGNOSIS — Z1211 Encounter for screening for malignant neoplasm of colon: Secondary | ICD-10-CM | POA: Insufficient documentation

## 2023-02-06 DIAGNOSIS — R194 Change in bowel habit: Secondary | ICD-10-CM | POA: Insufficient documentation

## 2023-02-06 DIAGNOSIS — R141 Gas pain: Secondary | ICD-10-CM | POA: Insufficient documentation

## 2023-02-19 ENCOUNTER — Other Ambulatory Visit: Payer: Self-pay | Admitting: Family Medicine

## 2023-02-19 DIAGNOSIS — F902 Attention-deficit hyperactivity disorder, combined type: Secondary | ICD-10-CM

## 2023-02-19 MED ORDER — AMPHETAMINE-DEXTROAMPHETAMINE 20 MG PO TABS: 20.0000 mg | ORAL_TABLET | Freq: Two times a day (BID) | ORAL | 0 refills | Status: DC

## 2023-02-19 NOTE — Telephone Encounter (Signed)
Addearll 20 mg LOV: 09/05/22 Last Refill:09/05/22 Upcoming appt: 07/11/23  [facility] and pt is asking for a 3 month rx

## 2023-02-19 NOTE — Telephone Encounter (Signed)
Encourage patient to contact the pharmacy for refills or they can request refills through [facility]   WHAT PHARMACY WOULD THEY LIKE THIS SENT TO:  [id number] [place], [place] - [address] AT [address]  MEDICATION NAME & DOSE: amphetamine-dextroamphetamine (ADDERALL) 20 MG tablet  NOTES/COMMENTS FROM PATIENT: Pt asks is it automatic 3 month refill?      Front office please notify patient: It takes 48-72 hours to process rx refill requests Ask patient to call pharmacy to ensure rx is ready before heading there.

## 2023-02-19 NOTE — Telephone Encounter (Signed)
Medication discussed at April 11 visit.  Controlled substance database reviewed.  #60 of dextroamphetamine dextroamphetamine 20 mg tabs filled on 01/18/2023, refill ordered.

## 2023-03-21 ENCOUNTER — Other Ambulatory Visit: Payer: Self-pay | Admitting: Family Medicine

## 2023-03-21 DIAGNOSIS — F902 Attention-deficit hyperactivity disorder, combined type: Secondary | ICD-10-CM

## 2023-03-21 MED ORDER — AMPHETAMINE-DEXTROAMPHETAMINE 20 MG PO TABS: 20.0000 mg | ORAL_TABLET | Freq: Two times a day (BID) | ORAL | 0 refills | Status: DC

## 2023-03-21 NOTE — Telephone Encounter (Signed)
Medication discussed at April 11 visit, working well at that time.  Controlled substance database reviewed.  Last filled #60 on 02/19/2023, previously 01/18/2023, refill ordered.

## 2023-03-21 NOTE — Telephone Encounter (Signed)
Patient is requesting a refill of the following medications: Requested Prescriptions   Pending Prescriptions Disp Refills   amphetamine-dextroamphetamine (ADDERALL) 20 MG tablet 60 tablet 0    Sig: Take 1 tablet (20 mg total) by mouth 2 (two) times daily.    Date of patient request: 03/21/23 Last office visit: 01/03/23 Date of last refill: 02/19/23 Last refill amount: 60 Follow up time period per chart: 6 months

## 2023-03-21 NOTE — Telephone Encounter (Signed)
Encourage patient to contact the pharmacy for refills or they can request refills through [facility]   WHAT PHARMACY WOULD THEY LIKE THIS SENT TO:  [id number] [place], [place] - [address] AT CORNER OF [facility] [address] [place] [address]    MEDICATION NAME & DOSE: amphetamine-dextroamphetamine (ADDERALL) 20 MG tablet  NOTES/COMMENTS FROM PATIENT:      Front office please notify patient: It takes 48-72 hours to process rx refill requests Ask patient to call pharmacy to ensure rx is ready before heading there.

## 2023-03-21 NOTE — Telephone Encounter (Signed)
Pt informed

## 2023-04-12 ENCOUNTER — Other Ambulatory Visit: Payer: Self-pay | Admitting: Family Medicine

## 2023-04-12 DIAGNOSIS — F4323 Adjustment disorder with mixed anxiety and depressed mood: Secondary | ICD-10-CM

## 2023-04-17 ENCOUNTER — Other Ambulatory Visit: Payer: Self-pay | Admitting: Family Medicine

## 2023-04-17 DIAGNOSIS — F902 Attention-deficit hyperactivity disorder, combined type: Secondary | ICD-10-CM

## 2023-04-17 MED ORDER — AMPHETAMINE-DEXTROAMPHETAMINE 20 MG PO TABS
20.0000 mg | ORAL_TABLET | Freq: Two times a day (BID) | ORAL | 0 refills | Status: DC
Start: 2023-04-17 — End: 2023-07-17

## 2023-04-17 MED ORDER — AMPHETAMINE-DEXTROAMPHETAMINE 20 MG PO TABS: 20.0000 mg | ORAL_TABLET | Freq: Two times a day (BID) | ORAL | 0 refills | Status: DC

## 2023-04-17 NOTE — Telephone Encounter (Signed)
1.Medication Requested: amphetamine-dextroamphetamine (ADDERALL) 20 MG tablet   2. Pharmacy (Name, Street, [place]): [phone] [place], [place] - [address] AT CORNER OF [facility] [address]   3. On Med List: yes   4. Last Visit with PCP: 4.11.24  5. Next visit date with PCP: 10.17.24   Agent: Please be advised that RX refills may take up to 3 business days. We ask that you follow-up with your pharmacy.

## 2023-04-17 NOTE — Telephone Encounter (Signed)
Medication discussed at her April 2024 visit.  Adderall 20 mg twice daily.  Controlled substance database reviewed.  #60 last filled on 03/21/2023, refills pended.

## 2023-04-17 NOTE — Telephone Encounter (Signed)
Patient is requesting a refill of the following medications: Requested Prescriptions   Pending Prescriptions Disp Refills   amphetamine-dextroamphetamine (ADDERALL) 20 MG tablet 60 tablet 0    Sig: Take 1 tablet (20 mg total) by mouth 2 (two) times daily.    Date of patient request: 04/17/23 Last office visit: 01/03/23 Date of last refill: 03/21/23 Last refill amount: 60 Follow up time period per chart: 07/11/23

## 2023-04-18 NOTE — Telephone Encounter (Signed)
Pt was informed she is doing well

## 2023-05-17 ENCOUNTER — Telehealth: Payer: Self-pay | Admitting: Family Medicine

## 2023-05-17 NOTE — Telephone Encounter (Signed)
Medication discussed at physical in April.  Stable on Adderall 20 mg twice daily.  65-month follow-up planned.  Controlled substance database reviewed.  Last prescription for #60 on 04/17/2023.   3 months of prescriptions were ordered on 04/13/2023, should have 2 more refills available.  Please check with pharmacy.

## 2023-05-17 NOTE — Telephone Encounter (Signed)
Last refill 04/17/2023 Last office visit 01/03/2023 Upcoming appt. 07/11/2023

## 2023-05-17 NOTE — Telephone Encounter (Signed)
I have informed the pt that she has 2 refills left on on her Adderall . I spoke to the pharmacy and I explained that she had 2 refills left they are filling the medication . I advised pt that after this refill she has one left then she will need an apt with Dr Neva Seat for a ADHD follow up . Pt expressed verbal understanding

## 2023-05-17 NOTE — Telephone Encounter (Signed)
Encourage patient to contact the pharmacy for refills or they can request refills through [facility]  WHAT PHARMACY WOULD THEY LIKE THIS SENT TO:  [id number] [place], [place] - [address] AT CORNER OF [facility] [address]  MEDICATION NAME & DOSE: amphetamine-dextroamphetamine (ADDERALL) 20 MG tablet  NOTES/COMMENTS FROM PATIENT:      Front office please notify patient: It takes 48-72 hours to process rx refill requests Ask patient to call pharmacy to ensure rx is ready before heading there.

## 2023-07-11 ENCOUNTER — Encounter: Payer: Self-pay | Admitting: Family Medicine

## 2023-07-11 ENCOUNTER — Telehealth (INDEPENDENT_AMBULATORY_CARE_PROVIDER_SITE_OTHER): Payer: Self-pay | Admitting: Family Medicine

## 2023-07-11 DIAGNOSIS — R7303 Prediabetes: Secondary | ICD-10-CM | POA: Diagnosis not present

## 2023-07-11 DIAGNOSIS — F4323 Adjustment disorder with mixed anxiety and depressed mood: Secondary | ICD-10-CM | POA: Diagnosis not present

## 2023-07-11 DIAGNOSIS — F902 Attention-deficit hyperactivity disorder, combined type: Secondary | ICD-10-CM

## 2023-07-11 DIAGNOSIS — E781 Pure hyperglyceridemia: Secondary | ICD-10-CM | POA: Diagnosis not present

## 2023-07-11 NOTE — Patient Instructions (Addendum)
Good talking with you today.  Glad to hear that the medication is working well and symptoms are stable.  Please have labs at the [facility] facility below in the next few weeks and if any concerns I will let you know.  Continue same dose of medications, have your pharmacy call when medications are due for refill.  26-month follow-up unless there are concerns in the meantime.  Take care!  [facility] or xray: Walk in 8:30-4:30 during weekdays, no appointment needed [address]  [place], [place] [address]

## 2023-07-11 NOTE — Progress Notes (Signed)
Virtual Visit via Video Note  I connected with Samantha Parker on 07/11/23 at 3:03 PM by a video enabled telemedicine application and verified that I am speaking with the correct person using two identifiers.  Difficulty with audio on video - audio by phone.  Patient location: home at parents home - by self.  My location: office - [place].    I discussed the limitations, risks, security and privacy concerns of performing an evaluation and management service by telephone and the availability of in person appointments. I also discussed with the patient that there may be a patient responsible charge related to this service. The patient expressed understanding and agreed to proceed, consent obtained  Chief complaint:  Chief Complaint  Patient presents with   ADHD    History of Present Illness: Samantha Parker is a 52 y.o. female Follow-up for medications, last visit in April  Attention deficit disorder.  Adderall 20mg  BID.  Working well. No side effects. Initial dose 8am, then 2pm.  No insomnia.  No appetite suppression or weight loss. No CP/palpitations.  Controlled substance database (PDMP) reviewed. No concerns appreciated. Last filled 06/18/23  Anxiety/depression Previous Lexapro 20 mg daily. Still doing well. No side effects.  PHQ score of 0 on my questioning today.      01/03/2023    2:21 PM 09/05/2022    4:09 PM 05/03/2022    9:40 AM 10/05/2021   11:22 AM 03/31/2021    2:39 PM  Depression screen PHQ 2/9  Decreased Interest 0 0 0 0 0  Down, Depressed, Hopeless 0 0 0 0 0  PHQ - 2 Score 0 0 0 0 0  Altered sleeping 0 0  0   Tired, decreased energy 0 0  0   Change in appetite 0 0  0   Feeling bad or failure about yourself  0 0  0   Trouble concentrating 0 0  0   Moving slowly or fidgety/restless 0 0  0   Suicidal thoughts 0 0  0   PHQ-9 Score 0 0  0   Difficult doing work/chores Not difficult at all   Not difficult at all     Prediabetes: Lab Results   Component Value Date   HGBA1C 5.9 01/03/2023   No recent weight check, no change in diet and exercise. Busy with move to new home.  Slight elevated triglycerides also in April. Wt Readings from Last 3 Encounters:  01/03/23 202 lb 9.6 oz (91.9 kg)  09/05/22 195 lb (88.5 kg)  05/03/22 200 lb 9.6 oz (91 kg)     Patient Active Problem List   Diagnosis Date Noted   Change in bowel habit 02/06/2023   Colon cancer screening 02/06/2023   Flatulence, eructation and gas pain 02/06/2023   Left lower quadrant pain 02/06/2023   History of colonic polyps 05/10/2020   Attention deficit hyperactivity disorder (ADHD), combined type 11/06/2019   Mixed anxiety and depressive disorder 07/21/2019   Diverticulosis of colon 10/07/2017   Past Medical History:  Diagnosis Date   Attention and concentration deficit 07/21/2019   Contact lens/glasses fitting    Depression    Forgetfulness 07/21/2019   Gallstones    Mixed anxiety and depressive disorder 07/21/2019   No pertinent past medical history    Past Surgical History:  Procedure Laterality Date   CHOLECYSTECTOMY  05/08/2012   Procedure: LAPAROSCOPIC CHOLECYSTECTOMY WITH INTRAOPERATIVE CHOLANGIOGRAM;  Surgeon: Cherylynn Ridges, MD;  Location: [facility];  Service: General;  Laterality: N/A;   ENDOMETRIAL ABLATION  2000   Allergies  Allergen Reactions   Penicillins Nausea Only and Rash    Rash will spread all over the body.   Prior to Admission medications   Medication Sig Start Date End Date Taking? Authorizing Provider  amphetamine-dextroamphetamine (ADDERALL) 20 MG tablet Take 1 tablet (20 mg total) by mouth 2 (two) times daily. 04/17/23  Yes Shade Flood, MD  escitalopram (LEXAPRO) 20 MG tablet TAKE 1 TABLET BY MOUTH EVERY DAY 04/12/23  Yes Shade Flood, MD  amphetamine-dextroamphetamine (ADDERALL) 20 MG tablet Take 1 tablet (20 mg total) by mouth 2 (two) times daily. 04/17/23 05/17/23  Shade Flood, MD   amphetamine-dextroamphetamine (ADDERALL) 20 MG tablet Take 1 tablet (20 mg total) by mouth 2 (two) times daily. 04/17/23   Shade Flood, MD   Social History   Socioeconomic History   Marital status: Single    Spouse name: Not on file   Number of children: 0   Years of education: Not on file   Highest education level: Not on file  Occupational History   Not on file  Tobacco Use   Smoking status: Never    Passive exposure: Yes   Smokeless tobacco: Never  Vaping Use   Vaping status: Never Used  Substance and Sexual Activity   Alcohol use: Yes    Alcohol/week: 3.0 standard drinks of alcohol    Types: 2 Glasses of wine, 1 Shots of liquor per week    Comment: occ   Drug use: No   Sexual activity: Yes  Other Topics Concern   Not on file  Social History Narrative   Not on file   Social Determinants of Health   Financial Resource Strain: Low Risk  (07/15/2018)   Overall Financial Resource Strain (CARDIA)    Difficulty of Paying Living Expenses: Not hard at all  Food Insecurity: No Food Insecurity (07/15/2018)   Hunger Vital Sign    Worried About Running Out of Food in the Last Year: Never true    Ran Out of Food in the Last Year: Never true  Transportation Needs: No Transportation Needs (07/15/2018)   PRAPARE - Administrator, Civil Service (Medical): No    Lack of Transportation (Non-Medical): No  Physical Activity: Sufficiently Active (07/15/2018)   Exercise Vital Sign    Days of Exercise per Week: 5 days    Minutes of Exercise per Session: 60 min  Stress: Stress Concern Present (07/15/2018)   [employer] of Occupational Health - Occupational Stress Questionnaire    Feeling of Stress : To some extent  Social Connections: Moderately Integrated (07/15/2018)   Social Connection and Isolation Panel [NHANES]    Frequency of Communication with Friends and Family: More than three times a week    Frequency of Social Gatherings with Friends and Family: More  than three times a week    Attends Religious Services: More than 4 times per year    Active Member of [employer] or Organizations: Yes    Attends Banker Meetings: More than 4 times per year    Marital Status: Never married  Intimate Partner Violence: Unknown (07/15/2018)   Humiliation, Afraid, Rape, and Kick questionnaire    Fear of Current or Ex-Partner: Patient declined    Emotionally Abused: Patient declined    Physically Abused: Patient declined    Sexually Abused: Patient declined    Observations/Objective: There were no vitals filed for this visit. Nontoxic appearance on  video.  Euthymic mood.  Speaking in full sentences without respiratory distress.  Does not appear to be responding to internal stimuli, all questions were answered with understanding plan expressed.  Assessment and Plan: Adjustment disorder with mixed anxiety and depressed mood  -Stable with current med regimen, continue Lexapro, 73-month follow-up.  Attention deficit hyperactivity disorder (ADHD), combined type  -Stable with current Adderall dosing without new side effects.  Will continue same with recheck in office in 6 months.  Meds should be due in the next 1 week.  Hypertriglyceridemia - Plan: Comprehensive metabolic panel, Lipid panel  -Slight elevated triglycerides on previous labs.  Diet/exercise approach along with prediabetes as below.  Check updated labs and adjust plan accordingly. Prediabetes - Plan: Hemoglobin A1c As above, check updated labs.  Follow Up Instructions: Patient Instructions  Good talking with you today.  Glad to hear that the medication is working well and symptoms are stable.  Please have labs at the [facility] below in the next few weeks and if any concerns I will let you know.  Continue same dose of medications, have your pharmacy call when medications are due for refill.  69-month follow-up unless there are concerns in the meantime.  Take care!  [facility]  Lab or xray: Walk in 8:30-4:30 during weekdays, no appointment needed [address]  [place], [place] [address]     I discussed the assessment and treatment plan with the patient. The patient was provided an opportunity to ask questions and all were answered. The patient agreed with the plan and demonstrated an understanding of the instructions.   The patient was advised to call back or seek an in-person evaluation if the symptoms worsen or if the condition fails to improve as anticipated.   Shade Flood, MD

## 2023-07-17 ENCOUNTER — Other Ambulatory Visit: Payer: Self-pay | Admitting: Family Medicine

## 2023-07-17 DIAGNOSIS — F902 Attention-deficit hyperactivity disorder, combined type: Secondary | ICD-10-CM

## 2023-07-17 MED ORDER — AMPHETAMINE-DEXTROAMPHETAMINE 20 MG PO TABS
20.0000 mg | ORAL_TABLET | Freq: Two times a day (BID) | ORAL | 0 refills | Status: DC
Start: 2023-07-17 — End: 2023-10-10

## 2023-07-17 MED ORDER — AMPHETAMINE-DEXTROAMPHETAMINE 20 MG PO TABS
20.0000 mg | ORAL_TABLET | Freq: Two times a day (BID) | ORAL | 0 refills | Status: DC
Start: 2023-07-17 — End: 2023-11-14

## 2023-07-17 MED ORDER — AMPHETAMINE-DEXTROAMPHETAMINE 20 MG PO TABS: 20.0000 mg | ORAL_TABLET | Freq: Two times a day (BID) | ORAL | 0 refills | Status: DC

## 2023-07-17 NOTE — Telephone Encounter (Signed)
Encourage patient to contact the pharmacy for refills or they can request refills through [facility]  WHAT PHARMACY WOULD THEY LIKE THIS SENT TO:  [id number] [place], [place] - [address] AT [address]NER OF [facility] [address] [place] [address]  MEDICATION NAME & DOSE: amphetamine-dextroamphetamine (ADDERALL) 20 MG tablet    NOTES/COMMENTS FROM PATIENT: Was supposed by be filled last week please fill urgent!     Front office please notify patient: It takes 48-72 hours to process rx refill requests Ask patient to call pharmacy to ensure rx is ready before heading there.

## 2023-07-17 NOTE — Telephone Encounter (Signed)
Patient is requesting a refill of the following medications: Requested Prescriptions   Pending Prescriptions Disp Refills   amphetamine-dextroamphetamine (ADDERALL) 20 MG tablet 60 tablet 0    Sig: Take 1 tablet (20 mg total) by mouth 2 (two) times daily.    Date of patient request: 07/17/23 Last office visit: 07/11/23 Date of last refill: 04/17/23 Last refill amount: 30 Follow up time period per chart: 6 months

## 2023-07-17 NOTE — Telephone Encounter (Signed)
This has been handled in another encounter, will sign and close this one

## 2023-07-17 NOTE — Addendum Note (Signed)
Addended by: Meredith Staggers R on: 07/17/2023 03:58 PM   Modules accepted: Orders

## 2023-10-10 ENCOUNTER — Other Ambulatory Visit: Payer: Self-pay | Admitting: Family Medicine

## 2023-10-10 DIAGNOSIS — F902 Attention-deficit hyperactivity disorder, combined type: Secondary | ICD-10-CM

## 2023-10-10 DIAGNOSIS — F4323 Adjustment disorder with mixed anxiety and depressed mood: Secondary | ICD-10-CM

## 2023-10-10 MED ORDER — ESCITALOPRAM OXALATE 20 MG PO TABS: 20.0000 mg | ORAL_TABLET | Freq: Every day | ORAL | 4 refills | Status: DC

## 2023-10-10 MED ORDER — AMPHETAMINE-DEXTROAMPHETAMINE 20 MG PO TABS: 20.0000 mg | ORAL_TABLET | Freq: Two times a day (BID) | ORAL | 0 refills | Status: DC

## 2023-10-10 NOTE — Telephone Encounter (Signed)
Requested Prescriptions   Pending Prescriptions Disp Refills   amphetamine-dextroamphetamine (ADDERALL) 20 MG tablet 60 tablet 0    Sig: Take 1 tablet (20 mg total) by mouth 2 (two) times daily.   amphetamine-dextroamphetamine (ADDERALL) 20 MG tablet 60 tablet 0    Sig: Take 1 tablet (20 mg total) by mouth 2 (two) times daily.   escitalopram (LEXAPRO) 20 MG tablet 90 tablet 4    Sig: Take 1 tablet (20 mg total) by mouth daily.     Date of patient request: 10/10/2023 Last office visit: 01/03/2023 Upcoming visit: Visit date not found Date of last refill: 04/12/2023 Last refill amount: 60

## 2023-10-10 NOTE — Telephone Encounter (Signed)
Copied from CRM [id number]. Topic: Clinical - Medication Refill >> Oct 10, 2023  2:15 PM Florestine Avers wrote: Most Recent Primary Care Visit:  Provider: Meredith Staggers R  Department: [facility]  Visit Type: MYCHART VIDEO VISIT  Date: 07/11/2023  Medication: amphetamine-dextroamphetamine (ADDERALL) 20 MG tablet  escitalopram (LEXAPRO) 20 MG tablet  Has the patient contacted their pharmacy? Yes (Agent: If no, request that the patient contact the pharmacy for the refill. If patient does not wish to contact the pharmacy document the reason why and proceed with request.) (Agent: If yes, when and what did the pharmacy advise?)  Is this the correct pharmacy for this prescription? Yes If no, delete pharmacy and type the correct one.  This is the patient's preferred pharmacy:  [id number] [place],  - [address] AT CORNER OF [facility] [address] [place] [address] Phone: [phone] Fax: [phone]   Has the prescription been filled recently? Yes  Is the patient out of the medication? Yes  Has the patient been seen for an appointment in the last year OR does the patient have an upcoming appointment? Yes  Can we respond through MyChart? Yes  Agent: Please be advised that Rx refills may take up to 3 business days. We ask that you follow-up with your pharmacy.

## 2023-10-10 NOTE — Telephone Encounter (Signed)
Office visit July 11, 2023.  Medication discussed at that time.  Plan for 75-month recheck.  Continue same dose Lexapro.  Meds refilled.Controlled substance database reviewed.  Last prescription filled for dextroamphetamine No. 60 on 09/13/2023.

## 2023-10-11 ENCOUNTER — Other Ambulatory Visit: Payer: Self-pay

## 2023-10-11 DIAGNOSIS — F902 Attention-deficit hyperactivity disorder, combined type: Secondary | ICD-10-CM

## 2023-10-11 MED ORDER — AMPHETAMINE-DEXTROAMPHETAMINE 20 MG PO TABS: 20.0000 mg | ORAL_TABLET | Freq: Two times a day (BID) | ORAL | 0 refills | Status: DC

## 2023-10-11 NOTE — Telephone Encounter (Signed)
Transmission to the pharmacy failed, requested medication be sent again, date shows last attempt to send 10/10/2023  Please advise

## 2023-10-11 NOTE — Telephone Encounter (Signed)
Re ordered

## 2023-11-14 ENCOUNTER — Other Ambulatory Visit: Payer: Self-pay | Admitting: Family Medicine

## 2023-11-14 DIAGNOSIS — F902 Attention-deficit hyperactivity disorder, combined type: Secondary | ICD-10-CM

## 2023-11-14 MED ORDER — AMPHETAMINE-DEXTROAMPHETAMINE 20 MG PO TABS: 20.0000 mg | ORAL_TABLET | Freq: Two times a day (BID) | ORAL | 0 refills | Status: DC

## 2023-11-14 MED ORDER — AMPHETAMINE-DEXTROAMPHETAMINE 20 MG PO TABS
20.0000 mg | ORAL_TABLET | Freq: Two times a day (BID) | ORAL | 0 refills | Status: DC
Start: 2023-11-14 — End: 2023-12-12

## 2023-11-14 NOTE — Telephone Encounter (Signed)
ADHD discussed at her last visit in October with 64-month follow-up planned.  Controlled substance database reviewed.  #60 of dextroamphetamine-amphetamine 20 mg last filled on 10/11/2023. 3 Refills ordered.  Please schedule appointment in April.

## 2023-11-14 NOTE — Telephone Encounter (Signed)
Copied from CRM [id number]. Topic: Clinical - Medication Refill >> Nov 14, 2023  9:31 AM Theodis Sato wrote: Most Recent Primary Care Visit:  Provider: Meredith Staggers R  Department: [facility]  Visit Type: MYCHART VIDEO VISIT  Date: 07/11/2023  Medication: amphetamine-dextroamphetamine (ADDERALL) 20 MG tablet  Has the patient contacted their pharmacy? No, controlled substance. (Agent: If no, request that the patient contact the pharmacy for the refill. If patient does not wish to contact the pharmacy document the reason why and proceed with request.) (Agent: If yes, when and what did the pharmacy advise?)  Is this the correct pharmacy for this prescription? Yes If no, delete pharmacy and type the correct one.  This is the patient's preferred pharmacy:  [id number] [place], [place] - [address] AT CORNER OF [facility] [address] [place] [address] Phone: [phone] Fax: [phone]   Has the prescription been filled recently? Yes  Is the patient out of the medication? Yes  Has the patient been seen for an appointment in the last year OR does the patient have an upcoming appointment? Yes  Can we respond through MyChart? No  Agent: Please be advised that Rx refills may take up to 3 business days. We ask that you follow-up with your pharmacy.

## 2023-12-12 ENCOUNTER — Other Ambulatory Visit: Payer: Self-pay | Admitting: Family Medicine

## 2023-12-12 DIAGNOSIS — F902 Attention-deficit hyperactivity disorder, combined type: Secondary | ICD-10-CM

## 2023-12-12 MED ORDER — AMPHETAMINE-DEXTROAMPHETAMINE 20 MG PO TABS: 20.0000 mg | ORAL_TABLET | Freq: Two times a day (BID) | ORAL | 0 refills | Status: DC

## 2023-12-12 NOTE — Telephone Encounter (Signed)
 Copied from CRM [id number]. Topic: Clinical - Medication Refill >> Dec 12, 2023  3:56 PM Adaysia C wrote: Most Recent Primary Care Visit:  Provider: Meredith Staggers R  Department: [facility]  Visit Type: MYCHART VIDEO VISIT  Date: 07/11/2023  Medication: amphetamine-dextroamphetamine (ADDERALL) 20 MG tablet  Has the patient contacted their pharmacy? No, patient contacted provider to initiate RX refill (Agent: If no, request that the patient contact the pharmacy for the refill. If patient does not wish to contact the pharmacy document the reason why and proceed with request.) (Agent: If yes, when and what did the pharmacy advise?)  Is this the correct pharmacy for this prescription? Yes If no, delete pharmacy and type the correct one.  This is the patient's preferred pharmacy:  [id number] [place], [place] - [address] AT CORNER OF [facility] [address] [place] [address] Phone: [phone] Fax: [phone]   Has the prescription been filled recently? Yes  Is the patient out of the medication? Yes  Has the patient been seen for an appointment in the last year OR does the patient have an upcoming appointment? Yes  Can we respond through MyChart? No  Agent: Please be advised that Rx refills may take up to 3 business days. We ask that you follow-up with your pharmacy.

## 2023-12-12 NOTE — Telephone Encounter (Signed)
 Requested Prescriptions   Pending Prescriptions Disp Refills   amphetamine-dextroamphetamine (ADDERALL) 20 MG tablet 60 tablet 0    Sig: Take 1 tablet (20 mg total) by mouth 2 (two) times daily.     Date of patient request: 12/12/2023 Last office visit: 01/03/2023 Upcoming visit: 12/23/2023 Date of last refill: 11/14/2023 Last refill amount: 60

## 2023-12-12 NOTE — Telephone Encounter (Signed)
 Medication discussed at 07/11/2023 office visit.  Continued on Adderall 20 mg twice daily.  Controlled substance database reviewed.  Dextroamphetamine dextroamphetamine 20 mg last filled #60 on 11/14/2023, refill ordered.

## 2023-12-23 ENCOUNTER — Ambulatory Visit (INDEPENDENT_AMBULATORY_CARE_PROVIDER_SITE_OTHER): Payer: Self-pay | Admitting: Family Medicine

## 2023-12-23 VITALS — BP 120/80 | HR 96

## 2023-12-23 DIAGNOSIS — F902 Attention-deficit hyperactivity disorder, combined type: Secondary | ICD-10-CM

## 2023-12-23 DIAGNOSIS — E781 Pure hyperglyceridemia: Secondary | ICD-10-CM

## 2023-12-23 DIAGNOSIS — R7303 Prediabetes: Secondary | ICD-10-CM

## 2023-12-23 DIAGNOSIS — F4323 Adjustment disorder with mixed anxiety and depressed mood: Secondary | ICD-10-CM

## 2023-12-23 NOTE — Progress Notes (Unsigned)
 Subjective:  Patient ID: Samantha Parker, female    DOB: 08/16/1971  Age: 53 y.o. MRN: [id number]  CC:  Chief Complaint  Patient presents with   Medical Management of Chronic Issues    Patient states no concerns.     HPI Samantha Parker presents for   Attention deficit disorder Treated with Adderall 20 mg twice daily.  Last discussed in October.  Working well at that time with initial dose at 8 AM, second dose at 2 PM without insomnia appetite suppression or weight loss.  Denies chest pain or palpitations at that time as well as today.  Controlled substance database reviewed, last filled on 12/13/2023.   Feels like may need higher dose. Difficultly with focus was prior to stressors. Some fatigue lately with the stressors, menopause.  No chest pain, dyspnea or palpitations.    Adjustment disorder Depression symptoms, last discussed in October, Lexapro 20 mg daily working well at that time. A lot of stressors, stress with parents health, more stressors past 6 months. Has therapist, has not met with her in over a year. Good fit in past.  Still taking lexapro daily - every day. Past few weeks has taken 1.5 pills per day. Feels liek may work a little better.   Hyperlipidemia Repeat labs today. No meds.  Lab Results  Component Value Date   CHOL 128 01/03/2023   HDL 58.80 01/03/2023   LDLCALC 29 01/03/2023   TRIG 198.0 (H) 01/03/2023   CHOLHDL 2 01/03/2023   Lab Results  Component Value Date   ALT 17 01/03/2023   AST 16 01/03/2023   ALKPHOS 99 01/03/2023   BILITOT 0.2 01/03/2023   Wt Readings from Last 3 Encounters:  01/03/23 202 lb 9.6 oz (91.9 kg)  09/05/22 195 lb (88.5 kg)  05/03/22 200 lb 9.6 oz (91 kg)  Weight measurement declined today.   Prediabetes: Repeat testing today and plan for follow up depending on labs.  Some sugar craving at times. Notes with menopause.  Less exercise. Just starting back with walking last few weeks.  Lab Results  Component Value Date    HGBA1C 5.9 01/03/2023   Wt Readings from Last 3 Encounters:  01/03/23 202 lb 9.6 oz (91.9 kg)  09/05/22 195 lb (88.5 kg)  05/03/22 200 lb 9.6 oz (91 kg)           History Patient Active Problem List   Diagnosis Date Noted   Change in bowel habit 02/06/2023   Colon cancer screening 02/06/2023   Flatulence, eructation and gas pain 02/06/2023   Left lower quadrant pain 02/06/2023   History of colonic polyps 05/10/2020   Attention deficit hyperactivity disorder (ADHD), combined type 11/06/2019   Mixed anxiety and depressive disorder 07/21/2019   Diverticulosis of colon 10/07/2017   Past Medical History:  Diagnosis Date   Attention and concentration deficit 07/21/2019   Contact lens/glasses fitting    Depression    Forgetfulness 07/21/2019   Gallstones    Mixed anxiety and depressive disorder 07/21/2019   No pertinent past medical history    Past Surgical History:  Procedure Laterality Date   CHOLECYSTECTOMY  05/08/2012   Procedure: LAPAROSCOPIC CHOLECYSTECTOMY WITH INTRAOPERATIVE CHOLANGIOGRAM;  Surgeon: Cherylynn Ridges, MD;  Location: [facility];  Service: General;  Laterality: N/A;   ENDOMETRIAL ABLATION  2000   Allergies  Allergen Reactions   Penicillins Nausea Only and Rash    Rash will spread all over the body.   Prior to Admission  medications   Medication Sig Start Date End Date Taking? Authorizing Provider  amphetamine-dextroamphetamine (ADDERALL) 20 MG tablet Take 1 tablet (20 mg total) by mouth 2 (two) times daily. 11/14/23  Yes Shade Flood, MD  amphetamine-dextroamphetamine (ADDERALL) 20 MG tablet Take 1 tablet (20 mg total) by mouth 2 (two) times daily. 11/14/23  Yes Shade Flood, MD  amphetamine-dextroamphetamine (ADDERALL) 20 MG tablet Take 1 tablet (20 mg total) by mouth 2 (two) times daily. 12/12/23 01/11/24 Yes Shade Flood, MD  escitalopram (LEXAPRO) 20 MG tablet Take 1 tablet (20 mg total) by mouth daily. 10/10/23  Yes  Shade Flood, MD   Social History   Socioeconomic History   Marital status: Single    Spouse name: Not on file   Number of children: 0   Years of education: Not on file   Highest education level: Not on file  Occupational History   Not on file  Tobacco Use   Smoking status: Never    Passive exposure: Yes   Smokeless tobacco: Never  Vaping Use   Vaping status: Never Used  Substance and Sexual Activity   Alcohol use: Yes    Alcohol/week: 3.0 standard drinks of alcohol    Types: 2 Glasses of wine, 1 Shots of liquor per week    Comment: occ   Drug use: No   Sexual activity: Yes  Other Topics Concern   Not on file  Social History Narrative   Not on file   Social Drivers of Health   Financial Resource Strain: Low Risk  (07/15/2018)   Overall Financial Resource Strain (CARDIA)    Difficulty of Paying Living Expenses: Not hard at all  Food Insecurity: No Food Insecurity (07/15/2018)   Hunger Vital Sign    Worried About Running Out of Food in the Last Year: Never true    Ran Out of Food in the Last Year: Never true  Transportation Needs: No Transportation Needs (07/15/2018)   PRAPARE - Administrator, Civil Service (Medical): No    Lack of Transportation (Non-Medical): No  Physical Activity: Sufficiently Active (07/15/2018)   Exercise Vital Sign    Days of Exercise per Week: 5 days    Minutes of Exercise per Session: 60 min  Stress: Stress Concern Present (07/15/2018)   [employer] of Occupational Health - Occupational Stress Questionnaire    Feeling of Stress : To some extent  Social Connections: Moderately Integrated (07/15/2018)   Social Connection and Isolation Panel [NHANES]    Frequency of Communication with Friends and Family: More than three times a week    Frequency of Social Gatherings with Friends and Family: More than three times a week    Attends Religious Services: More than 4 times per year    Active Member of [employer] or Organizations:  Yes    Attends Banker Meetings: More than 4 times per year    Marital Status: Never married  Intimate Partner Violence: Unknown (07/15/2018)   Humiliation, Afraid, Rape, and Kick questionnaire    Fear of Current or Ex-Partner: Patient declined    Emotionally Abused: Patient declined    Physically Abused: Patient declined    Sexually Abused: Patient declined    Review of Systems  Per HPI Objective:   Vitals:   12/23/23 1633  BP: 120/80  Pulse: 96  SpO2: 96%     Physical Exam Vitals reviewed.  Constitutional:      Appearance: Normal appearance. She is well-developed.  HENT:  Head: Normocephalic and atraumatic.  Eyes:     Conjunctiva/sclera: Conjunctivae normal.     Pupils: Pupils are equal, round, and reactive to light.  Neck:     Vascular: No carotid bruit.  Cardiovascular:     Rate and Rhythm: Normal rate and regular rhythm.     Heart sounds: Normal heart sounds.  Pulmonary:     Effort: Pulmonary effort is normal.     Breath sounds: Normal breath sounds.  Abdominal:     Palpations: Abdomen is soft. There is no pulsatile mass.     Tenderness: There is no abdominal tenderness.  Musculoskeletal:     Right lower leg: No edema.     Left lower leg: No edema.  Skin:    General: Skin is warm and dry.  Neurological:     Mental Status: She is alert and oriented to person, place, and time.  Psychiatric:        Mood and Affect: Mood normal.        Behavior: Behavior normal.        Assessment & Plan:  Samantha Parker is a 53 y.o. female . Attention deficit hyperactivity disorder (ADHD), combined type  -Some decreased focus but I am suspicious that underlying stress/anxiety symptoms may be contributing.  She is at recommended max dosing of Adderall at this time at total dose of 40 mg/day.  Will continue same dose for now, anxiety/stress treatment as below.  If persistent decreased focus, could consider meeting with psychiatry to decide if change in  medications or higher dose of stimulant indicated.  Prediabetes - Plan: Hemoglobin A1c  -Check A1c, but we did discuss initial diet/exercise approach and understandably some weight gain with stress eating, food choices.  Commended on recent increased activity/exercise, I do think this will help not only with weight, prediabetes but also with stress/mood symptoms.  Hypertriglyceridemia - Plan: Lipid panel, Comprehensive metabolic panel  -Check labs and adjust plan accordingly, no new meds for now.  Adjustment disorder with mixed anxiety and depressed mood  -Increase stressors as above.  I do think meeting back with her therapist will be helpful and she plans to reach out for appointment.  Handout given on stress and stress management.  Commended on return to exercise.  Will continue same dose of Lexapro for now.  Will follow-up in 6 weeks but let her know that I am happy to see her sooner if needed.  No orders of the defined types were placed in this encounter.  Patient Instructions  I do recommend meeting with your therapist.  No more than 20 mg per day of Lexapro per day.  You are at max dosing of Adderall at this time. No change in dose for now.   Information stress and stress management below.  I do think meeting with your therapist may be helpful.  Try to cut back on stress eating, and again look at some of the methods below 1 helping managing the stressors.  Exercises wonderful, glad to hear that you have started back with exercise.  Low intensity exercise most days per week as initial goal and ultimately 150 minutes/week as a goal.  Pain then please let me know if there are questions.   I will let you know once I review the labs from today.  As we discussed if those labs are slightly higher, I think the increased exercise and change in diet would be the initial approach.  Take care!  Managing Stress, Adult Feeling a certain  amount of stress is normal. Stress helps our body and mind get  ready to deal with the demands of life. Stress hormones can motivate you to do well at work and meet your responsibilities. But severe or long-term (chronic) stress can affect your mental and physical health. Chronic stress puts you at higher risk for: Anxiety and depression. Other health problems such as digestive problems, muscle aches, heart disease, high blood pressure, and stroke. What are the causes? Common causes of stress include: Demands from work, such as deadlines, feeling overworked, or having long hours. Pressures at home, such as money issues, disagreements with a spouse, or parenting issues. Pressures from major life changes, such as divorce, moving, loss of a loved one, or chronic illness. You may be at higher risk for stress-related problems if you: Do not get enough sleep. Are in poor health. Do not have emotional support. Have a mental health disorder such as anxiety or depression. How to recognize stress Stress can make you: Have trouble sleeping. Feel sad, anxious, irritable, or overwhelmed. Lose your appetite. Overeat or want to eat unhealthy foods. Want to use drugs or alcohol. Stress can also cause physical symptoms, such as: Sore, tense muscles, especially in the shoulders and neck. Headaches. Trouble breathing. A faster heart rate. Stomach pain, nausea, or vomiting. Diarrhea or constipation. Trouble concentrating. Follow these instructions at home: Eating and drinking Eat a healthy diet. This includes: Eating foods that are high in fiber, such as beans, whole grains, and fresh fruits and vegetables. Limiting foods that are high in fat and processed sugars, such as fried or sweet foods. Do not skip meals or overeat. Drink enough fluid to keep your urine pale yellow. Alcohol use Do not drink alcohol if: Your health care provider tells you not to drink. You are pregnant, may be pregnant, or are planning to become pregnant. Drinking alcohol is a way some  people try to ease their stress. This can be dangerous, so if you drink alcohol: Limit how much you have to: 0-1 drink a day for women. 0-2 drinks a day for men. Know how much alcohol is in your drink. In the [place] one drink equals one 12 oz bottle of beer (355 mL), one 5 oz glass of wine (148 mL), or one 1 oz glass of hard liquor (44 mL). Activity  Include 30 minutes of exercise in your daily schedule. Exercise is a good stress reducer. Include time in your day for an activity that you find relaxing. Try taking a walk, going on a bike ride, reading a book, or listening to music. Schedule your time in a way that lowers stress, and keep a regular schedule. Focus on doing what is most important to get done. Lifestyle Identify the source of your stress and your reaction to it. See a therapist who can help you change unhelpful reactions. When there are stressful events: Talk about them with family, friends, or coworkers. Try to think realistically about stressful events and not ignore them or overreact. Try to find the positives in a stressful situation and not focus on the negatives. Cut back on responsibilities at work and home, if possible. Ask for help from friends or family members if you need it. Find ways to manage stress, such as: Mindfulness, meditation, or deep breathing. Yoga or tai chi. Progressive muscle relaxation. Spending time in nature. Doing art, playing music, or reading. Making time for fun activities. Spending time with family and friends. Get support from family, friends, or spiritual  resources. General instructions Get enough sleep. Try to go to sleep and get up at about the same time every day. Take over-the-counter and prescription medicines only as told by your health care provider. Do not use any products that contain nicotine or tobacco. These products include cigarettes, chewing tobacco, and vaping devices, such as e-cigarettes. If you need help quitting, ask  your health care provider. Do not use drugs or smoke to deal with stress. Keep all follow-up visits. This is important. Where to find support Talk with your health care provider about stress management or finding a support group. Find a therapist to work with you on your stress management techniques. Where to find more information The [employer] on Mental Illness: www.nami.org American Psychological Association: [employer] Contact a health care provider if: Your stress symptoms get worse. You are unable to manage your stress at home. You are struggling to stop using drugs or alcohol. Get help right away if: You may be a danger to yourself or others. You have any thoughts of death or suicide. Get help right awayif you feel like you may hurt yourself or others, or have thoughts about taking your own life. Go to your nearest emergency room or: Call 911. Call the National Suicide Prevention Lifeline at (458) 370-6900 or 988 in the [place] This is open 24 hours a day. If you're a Veteran: Call 988 and press 1. This is open 24 hours a day. Text the [employer] at 714-738-7736. Summary Feeling a certain amount of stress is normal, but severe or long-term (chronic) stress can affect your mental and physical health. Chronic stress can put you at higher risk for anxiety, depression, and other health problems such as digestive problems, muscle aches, heart disease, high blood pressure, and stroke. You may be at higher risk for stress-related problems if you do not get enough sleep, are in poor health, lack emotional support, or have a mental health disorder such as anxiety or depression. Identify the source of your stress and your reaction to it. Try talking about stressful events with family, friends, or coworkers, finding a coping method, or getting support from spiritual resources. If you need more help, talk with your health care provider about finding a support group or a mental health  therapist. This information is not intended to replace advice given to you by your health care provider. Make sure you discuss any questions you have with your health care provider. Document Revised: 04/25/2023 Document Reviewed: 04/04/2021 Elsevier Patient Education  2024 Elsevier Inc.         Signed,   Meredith Staggers, MD [facility], [facility] 12/24/23 8:17 AM

## 2023-12-23 NOTE — Patient Instructions (Addendum)
 I do recommend meeting with your therapist.  No more than 20 mg per day of Lexapro per day.  You are at max dosing of Adderall at this time. No change in dose for now.   Information stress and stress management below.  I do think meeting with your therapist may be helpful.  Try to cut back on stress eating, and again look at some of the methods below 1 helping managing the stressors.  Exercises wonderful, glad to hear that you have started back with exercise.  Low intensity exercise most days per week as initial goal and ultimately 150 minutes/week as a goal.  Pain then please let me know if there are questions.   I will let you know once I review the labs from today.  As we discussed if those labs are slightly higher, I think the increased exercise and change in diet would be the initial approach.  Take care!  Managing Stress, Adult Feeling a certain amount of stress is normal. Stress helps our body and mind get ready to deal with the demands of life. Stress hormones can motivate you to do well at work and meet your responsibilities. But severe or long-term (chronic) stress can affect your mental and physical health. Chronic stress puts you at higher risk for: Anxiety and depression. Other health problems such as digestive problems, muscle aches, heart disease, high blood pressure, and stroke. What are the causes? Common causes of stress include: Demands from work, such as deadlines, feeling overworked, or having long hours. Pressures at home, such as money issues, disagreements with a spouse, or parenting issues. Pressures from major life changes, such as divorce, moving, loss of a loved one, or chronic illness. You may be at higher risk for stress-related problems if you: Do not get enough sleep. Are in poor health. Do not have emotional support. Have a mental health disorder such as anxiety or depression. How to recognize stress Stress can make you: Have trouble sleeping. Feel sad,  anxious, irritable, or overwhelmed. Lose your appetite. Overeat or want to eat unhealthy foods. Want to use drugs or alcohol. Stress can also cause physical symptoms, such as: Sore, tense muscles, especially in the shoulders and neck. Headaches. Trouble breathing. A faster heart rate. Stomach pain, nausea, or vomiting. Diarrhea or constipation. Trouble concentrating. Follow these instructions at home: Eating and drinking Eat a healthy diet. This includes: Eating foods that are high in fiber, such as beans, whole grains, and fresh fruits and vegetables. Limiting foods that are high in fat and processed sugars, such as fried or sweet foods. Do not skip meals or overeat. Drink enough fluid to keep your urine pale yellow. Alcohol use Do not drink alcohol if: Your health care provider tells you not to drink. You are pregnant, may be pregnant, or are planning to become pregnant. Drinking alcohol is a way some people try to ease their stress. This can be dangerous, so if you drink alcohol: Limit how much you have to: 0-1 drink a day for women. 0-2 drinks a day for men. Know how much alcohol is in your drink. In the [place] one drink equals one 12 oz bottle of beer (355 mL), one 5 oz glass of wine (148 mL), or one 1 oz glass of hard liquor (44 mL). Activity  Include 30 minutes of exercise in your daily schedule. Exercise is a good stress reducer. Include time in your day for an activity that you find relaxing. Try taking a walk, going on  a bike ride, reading a book, or listening to music. Schedule your time in a way that lowers stress, and keep a regular schedule. Focus on doing what is most important to get done. Lifestyle Identify the source of your stress and your reaction to it. See a therapist who can help you change unhelpful reactions. When there are stressful events: Talk about them with family, friends, or coworkers. Try to think realistically about stressful events and not  ignore them or overreact. Try to find the positives in a stressful situation and not focus on the negatives. Cut back on responsibilities at work and home, if possible. Ask for help from friends or family members if you need it. Find ways to manage stress, such as: Mindfulness, meditation, or deep breathing. Yoga or tai chi. Progressive muscle relaxation. Spending time in nature. Doing art, playing music, or reading. Making time for fun activities. Spending time with family and friends. Get support from family, friends, or spiritual resources. General instructions Get enough sleep. Try to go to sleep and get up at about the same time every day. Take over-the-counter and prescription medicines only as told by your health care provider. Do not use any products that contain nicotine or tobacco. These products include cigarettes, chewing tobacco, and vaping devices, such as e-cigarettes. If you need help quitting, ask your health care provider. Do not use drugs or smoke to deal with stress. Keep all follow-up visits. This is important. Where to find support Talk with your health care provider about stress management or finding a support group. Find a therapist to work with you on your stress management techniques. Where to find more information The [employer] on Mental Illness: www.nami.org American Psychological Association: [employer] Contact a health care provider if: Your stress symptoms get worse. You are unable to manage your stress at home. You are struggling to stop using drugs or alcohol. Get help right away if: You may be a danger to yourself or others. You have any thoughts of death or suicide. Get help right awayif you feel like you may hurt yourself or others, or have thoughts about taking your own life. Go to your nearest emergency room or: Call 911. Call the National Suicide Prevention Lifeline at (561)375-4422 or 988 in the [place] This is open 24 hours a day. If  you're a Veteran: Call 988 and press 1. This is open 24 hours a day. Text the [employer] at (701) 063-1976. Summary Feeling a certain amount of stress is normal, but severe or long-term (chronic) stress can affect your mental and physical health. Chronic stress can put you at higher risk for anxiety, depression, and other health problems such as digestive problems, muscle aches, heart disease, high blood pressure, and stroke. You may be at higher risk for stress-related problems if you do not get enough sleep, are in poor health, lack emotional support, or have a mental health disorder such as anxiety or depression. Identify the source of your stress and your reaction to it. Try talking about stressful events with family, friends, or coworkers, finding a coping method, or getting support from spiritual resources. If you need more help, talk with your health care provider about finding a support group or a mental health therapist. This information is not intended to replace advice given to you by your health care provider. Make sure you discuss any questions you have with your health care provider. Document Revised: 04/25/2023 Document Reviewed: 04/04/2021 Elsevier Patient Education  2024 [employer].

## 2023-12-24 ENCOUNTER — Encounter: Payer: Self-pay | Admitting: Family Medicine

## 2023-12-24 LAB — COMPREHENSIVE METABOLIC PANEL WITH GFR
ALT: 20 U/L (ref 0–35)
AST: 17 U/L (ref 0–37)
Albumin: 4.4 g/dL (ref 3.5–5.2)
Alkaline Phosphatase: 110 U/L (ref 39–117)
BUN: 12 mg/dL (ref 6–23)
CO2: 28 meq/L (ref 19–32)
Calcium: 9.5 mg/dL (ref 8.4–10.5)
Chloride: 102 meq/L (ref 96–112)
Creatinine, Ser: 0.78 mg/dL (ref 0.40–1.20)
GFR: 86.01 mL/min (ref >60.00)
Glucose, Bld: 101 mg/dL — ABNORMAL HIGH (ref 70–99)
Potassium: 4.1 meq/L (ref 3.5–5.1)
Sodium: 139 meq/L (ref 135–145)
Total Bilirubin: 0.3 mg/dL (ref 0.2–1.2)
Total Protein: 7.3 g/dL (ref 6.0–8.3)

## 2023-12-24 LAB — LIPID PANEL
Cholesterol: 154 mg/dL (ref 0–200)
HDL: 61.8 mg/dL (ref >39.00)
LDL Cholesterol: 59 mg/dL (ref 0–99)
NonHDL: 92.23
Total CHOL/HDL Ratio: 2
Triglycerides: 167 mg/dL — ABNORMAL HIGH (ref 0.0–149.0)
VLDL: 33.4 mg/dL (ref 0.0–40.0)

## 2023-12-24 LAB — HEMOGLOBIN A1C: Hgb A1c MFr Bld: 6 % (ref 4.6–6.5)

## 2023-12-25 ENCOUNTER — Telehealth: Payer: Self-pay

## 2023-12-25 NOTE — Telephone Encounter (Signed)
-----   Message from Shade Flood sent at 12/24/2023  8:57 PM EDT ----- Call patient.  Triglycerides slightly elevated but not as high as 11 months ago and other cholesterol numbers looked okay.  Blood sugar only a few points elevated, overall electrolytes looked okay.  12-month blood sugar test is similar to her previous reading.  No major changes in labs recently.  Keep follow-up as planned but let me know if there are questions.

## 2023-12-26 NOTE — Telephone Encounter (Signed)
 Patient called back and has been informed of results and was very pleased

## 2023-12-26 NOTE — Telephone Encounter (Signed)
 Called patient to relay lab results, was unable to leave VM due to Vm box being full. Did try to call patients work as well with no answer.

## 2024-01-13 ENCOUNTER — Other Ambulatory Visit: Payer: Self-pay | Admitting: Family Medicine

## 2024-01-13 DIAGNOSIS — F902 Attention-deficit hyperactivity disorder, combined type: Secondary | ICD-10-CM

## 2024-01-13 NOTE — Telephone Encounter (Signed)
 Copied from CRM [id number]. Topic: Clinical - Medication Refill >> Jan 13, 2024 11:15 AM Bambi Bonine D wrote: Most Recent Primary Care Visit:  Provider: Caro Christmas R  Department: [facility]  Visit Type: OFFICE VISIT  Date: 12/23/2023  Medication: amphetamine -dextroamphetamine  (ADDERALL) 20 MG tablet  Has the patient contacted their pharmacy? Yes (Agent: If no, request that the patient contact the pharmacy for the refill. If patient does not wish to contact the pharmacy document the reason why and proceed with request.) (Agent: If yes, when and what did the pharmacy advise?)  Is this the correct pharmacy for this prescription? Yes If no, delete pharmacy and type the correct one.  This is the patient's preferred pharmacy:  [id number] - [place], [place] - [address] AT CORNER OF [facility] [address] [place] [address] Phone: [phone] Fax: [phone]   Has the prescription been filled recently? No  Is the patient out of the medication? Yes  Has the patient been seen for an appointment in the last year OR does the patient have an upcoming appointment? Yes  Can we respond through MyChart? No  Agent: Please be advised that Rx refills may take up to 3 business days. We ask that you follow-up with your pharmacy.

## 2024-01-14 MED ORDER — AMPHETAMINE-DEXTROAMPHETAMINE 20 MG PO TABS: 20.0000 mg | ORAL_TABLET | Freq: Two times a day (BID) | ORAL | 0 refills | Status: DC

## 2024-01-14 NOTE — Telephone Encounter (Signed)
 Office visit March 31.  Discussed Adderall at that time, remaining at 40 mg total per day.  Controlled substance database reviewed.  Adderall 20 mg #60 last filled on 12/13/2023, refill ordered for 30-day supply and 1 additional prescription to fill in 30 days.  Keep appointment as scheduled May 21st.

## 2024-01-23 ENCOUNTER — Ambulatory Visit: Payer: Self-pay | Admitting: Family Medicine

## 2024-02-12 ENCOUNTER — Ambulatory Visit: Payer: Self-pay | Admitting: Family Medicine

## 2024-02-12 ENCOUNTER — Other Ambulatory Visit: Payer: Self-pay | Admitting: Family Medicine

## 2024-02-12 DIAGNOSIS — F902 Attention-deficit hyperactivity disorder, combined type: Secondary | ICD-10-CM

## 2024-02-12 NOTE — Telephone Encounter (Signed)
 Medication: Adderall Directions: Take 1 tablet (20 mg total) by mouth 2 (two) times daily.  Last given: 01/14/2024 Number refills: 0 Last o/v: 12/23/2023 Follow up: I can't find F/U date Labs:  12/23/2023

## 2024-02-12 NOTE — Telephone Encounter (Signed)
 Copied from CRM [id number]. Topic: Clinical - Medication Refill >> Feb 12, 2024  1:47 PM Howard Macho wrote: Medication: amphetamine -dextroamphetamine  (ADDERALL) 20 MG tablet  Has the patient contacted their pharmacy? No (Agent: If no, request that the patient contact the pharmacy for the refill. If patient does not wish to contact the pharmacy document the reason why and proceed with request.) (Agent: If yes, when and what did the pharmacy advise?)  This is the patient's preferred pharmacy:  [id number] - [place], [place] - [address] AT [address] OF [facility] [address] [place] [address] Phone: [phone] Fax: [phone]  Is this the correct pharmacy for this prescription? Yes If no, delete pharmacy and type the correct one.   Has the prescription been filled recently? Yes  Is the patient out of the medication? Yes  Has the patient been seen for an appointment in the last year OR does the patient have an upcoming appointment? Yes  Can we respond through MyChart? No  Agent: Please be advised that Rx refills may take up to 3 business days. We ask that you follow-up with your pharmacy.

## 2024-02-13 NOTE — Telephone Encounter (Signed)
 Last visit in March.  6-week follow-up recommended at that time.  Appointment was canceled for yesterday but I do see that she has rescheduled for July 9.  Will need to keep that follow-up, will refill medication for now.  Controlled substance database reviewed.  Dextroamphetamine  dextroamphetamine  No. 60 last filled on 01/14/2024.   I started to send a refill, but I did send in a prescription on April 22 to be filled in 30 days, please check with pharmacy and release that prescription.

## 2024-02-14 NOTE — Telephone Encounter (Signed)
 This is filled and waiting at the pharmacy for the patient.

## 2024-02-14 NOTE — Telephone Encounter (Signed)
 Called and informed her of the fill at the pharmacy

## 2024-03-19 ENCOUNTER — Other Ambulatory Visit: Payer: Self-pay | Admitting: Family Medicine

## 2024-03-19 DIAGNOSIS — F902 Attention-deficit hyperactivity disorder, combined type: Secondary | ICD-10-CM

## 2024-03-19 MED ORDER — AMPHETAMINE-DEXTROAMPHETAMINE 20 MG PO TABS
20.0000 mg | ORAL_TABLET | Freq: Two times a day (BID) | ORAL | 0 refills | Status: DC
Start: 2024-03-19 — End: 2024-05-19

## 2024-03-19 NOTE — Telephone Encounter (Signed)
 Appointment noted on July 9, medication discussed at her March 31 visit, appointments were scheduled on May 1, May 21 but canceled.  Please make sure she keeps her July 9 appointment and I will refill medication temporarily.  Controlled substance database reviewed.  Medication last filled for #60 on 02/13/2024.  No concerns.

## 2024-03-19 NOTE — Telephone Encounter (Signed)
 Requested Prescriptions   Pending Prescriptions Disp Refills   amphetamine -dextroamphetamine  (ADDERALL) 20 MG tablet 60 tablet 0    Sig: Take 1 tablet (20 mg total) by mouth 2 (two) times daily.     Date of patient request: 03/19/2024 Last office visit: 12/23/2023 Upcoming visit: 04/01/2024 Date of last refill: 01/14/2024 Last refill amount: 60

## 2024-03-19 NOTE — Telephone Encounter (Unsigned)
 Copied from CRM [id number]. Topic: Clinical - Medication Refill >> Mar 19, 2024  2:12 PM [name] wrote: Medication: amphetamine -dextroamphetamine  (ADDERALL) 20 MG tablet  Has the patient contacted their pharmacy? Yes- prescription expired. (Agent: If no, request that the patient contact the pharmacy for the refill. If patient does not wish to contact the pharmacy document the reason why and proceed with request.) (Agent: If yes, when and what did the pharmacy advise?)  This is the patient's preferred pharmacy:  [id number] - [place], [place] - [address] AT [address]NER OF [facility] [address] [place] [address] Phone: [phone] Fax: [phone]  Is this the correct pharmacy for this prescription? Yes If no, delete pharmacy and type the correct one.   Has the prescription been filled recently? No  Is the patient out of the medication? Yes- completely out.  Has the patient been seen for an appointment in the last year OR does the patient have an upcoming appointment? Yes  Can we respond through MyChart? Yes  Agent: Please be advised that Rx refills may take up to 3 business days. We ask that you follow-up with your pharmacy.

## 2024-04-01 ENCOUNTER — Ambulatory Visit: Payer: Self-pay | Admitting: Family Medicine

## 2024-04-06 ENCOUNTER — Encounter: Payer: Self-pay | Admitting: Family Medicine

## 2024-05-19 ENCOUNTER — Other Ambulatory Visit: Payer: Self-pay | Admitting: Family Medicine

## 2024-05-19 DIAGNOSIS — F4323 Adjustment disorder with mixed anxiety and depressed mood: Secondary | ICD-10-CM

## 2024-05-19 DIAGNOSIS — F902 Attention-deficit hyperactivity disorder, combined type: Secondary | ICD-10-CM

## 2024-05-19 MED ORDER — ESCITALOPRAM OXALATE 20 MG PO TABS: 20.0000 mg | ORAL_TABLET | Freq: Every day | ORAL | 3 refills | Status: AC

## 2024-05-19 MED ORDER — AMPHETAMINE-DEXTROAMPHETAMINE 20 MG PO TABS: 20.0000 mg | ORAL_TABLET | Freq: Two times a day (BID) | ORAL | 0 refills | Status: DC

## 2024-05-19 NOTE — Telephone Encounter (Signed)
 Patient has been made an appointment for 06/03/2024

## 2024-05-19 NOTE — Telephone Encounter (Signed)
 Last visit March 31.  Continued at total dose of Adderall to 40 mg daily, as well as Lexapro .  Controlled substance database reviewed.  Adderall 20 mg #60 last filled on 04/18/2024.  Previously 03/19/2024.  No concerns.  We had discussed a follow-up in 6 weeks at last visit.  It looks like her appointment in May was canceled, and then again canceled a few weeks later, no-show for appointment July 9.    Needs to be seen within the next 1 month for follow-up of medication.  I will refill meds for 1 additional month, but please schedule appointment.

## 2024-05-19 NOTE — Telephone Encounter (Unsigned)
 Copied from CRM #[id number]. Topic: Clinical - Medication Refill >> May 19, 2024  8:15 AM [place] E wrote: Medication: amphetamine -dextroamphetamine  (ADDERALL) 20 MG tablet  escitalopram  (LEXAPRO ) 20 MG tablet  Has the patient contacted their pharmacy? Yes (Agent: If no, request that the patient contact the pharmacy for the refill. If patient does not wish to contact the pharmacy document the reason why and proceed with request.) (Agent: If yes, when and what did the pharmacy advise?)  This is the patient's preferred pharmacy:  [id number] - [place], [place] - [address] AT CORNER OF [facility] [address] [place] [address] Phone: [phone] Fax: [phone]  Is this the correct pharmacy for this prescription? Yes If no, delete pharmacy and type the correct one.   Has the prescription been filled recently? No  Is the patient out of the medication? Yes  Has the patient been seen for an appointment in the last year OR does the patient have an upcoming appointment? Yes  Can we respond through MyChart? No  Agent: Please be advised that Rx refills may take up to 3 business days. We ask that you follow-up with your pharmacy.

## 2024-05-19 NOTE — Telephone Encounter (Signed)
 Called patient and was unable to leave vm due to mailbox being full

## 2024-06-03 ENCOUNTER — Encounter: Payer: Self-pay | Admitting: Family Medicine

## 2024-06-03 ENCOUNTER — Ambulatory Visit (INDEPENDENT_AMBULATORY_CARE_PROVIDER_SITE_OTHER): Payer: Self-pay | Admitting: Family Medicine

## 2024-06-03 VITALS — BP 122/82 | HR 83 | Temp 98.0°F | Resp 16 | Ht 72.0 in

## 2024-06-03 DIAGNOSIS — K439 Ventral hernia without obstruction or gangrene: Secondary | ICD-10-CM | POA: Insufficient documentation

## 2024-06-03 DIAGNOSIS — R7303 Prediabetes: Secondary | ICD-10-CM

## 2024-06-03 DIAGNOSIS — Z23 Encounter for immunization: Secondary | ICD-10-CM

## 2024-06-03 DIAGNOSIS — E781 Pure hyperglyceridemia: Secondary | ICD-10-CM

## 2024-06-03 DIAGNOSIS — F4323 Adjustment disorder with mixed anxiety and depressed mood: Secondary | ICD-10-CM

## 2024-06-03 DIAGNOSIS — F902 Attention-deficit hyperactivity disorder, combined type: Secondary | ICD-10-CM

## 2024-06-03 NOTE — Patient Instructions (Signed)
 Please call your gynecologist for discussion of pap testing, mammogram.   Thank you for coming in today. No change in medications at this time. If there are any concerns on your bloodwork, I will let you know. Take care!

## 2024-06-03 NOTE — Progress Notes (Signed)
 Subjective:  Patient ID: Samantha Parker, female    DOB: 1971-01-23  Age: 53 y.o. MRN: [id number]  CC:  Chief Complaint  Patient presents with   ADHD    No questions or concerns.    Anxiety   Depression    HPI Samantha Parker presents for   Attention deficit disorder Discussed in March, some difficulty with focus due to stressors, but was on Adderall 20 mg twice daily at that time, 8 AM, 2 PM.  No insomnia appetite suppression or weight loss then or currently.  Denies palpitations.  Given recommended max dosing of Adderall at 40 mg continue same regimen with option to follow-up with psychiatry if any need for additional control or change in regimen. Still taking 20 mg twice daily.  Controlled substance database reviewed.  #60 last filled on 05/19/2024.  Past 1 year fill dates reviewed.  No concerns Meds working well. Focus doing well now. Prior stressors are handled better now. No new side effects.   Depression: With anxiety, stress, treated with Lexapro  20 mg daily.  Increase stressors discussed at her March visit, had been taking higher dosing at 1.5 pills/day at that time.  Recommended to max at 20 mg daily, and follow-up with her previous therapist.  Had initially planned on 6-week follow-up, symptoms improved.  Doing well - current med working well, no new side effects.       06/03/2024    3:15 PM 12/23/2023    4:34 PM 01/03/2023    2:21 PM 09/05/2022    4:09 PM 05/03/2022    9:40 AM  Depression screen PHQ 2/9  Decreased Interest 0 0 0 0 0  Down, Depressed, Hopeless 0 0 0 0 0  PHQ - 2 Score 0 0 0 0 0  Altered sleeping 0 0 0 0   Tired, decreased energy 0 0 0 0   Change in appetite 0 0 0 0   Feeling bad or failure about yourself  0 0 0 0   Trouble concentrating 0 0 0 0   Moving slowly or fidgety/restless 0 0 0 0   Suicidal thoughts 0 0 0 0   PHQ-9 Score 0 0 0 0   Difficult doing work/chores Not difficult at all  Not difficult at all      Hyperlipidemia: Hypertriglyceridemia.  Labs in March.  LDL looked okay at 59.  Only mild elevation, new meds.  Diet/exercise approach. Lab Results  Component Value Date   CHOL 154 12/23/2023   HDL 61.80 12/23/2023   LDLCALC 59 12/23/2023   TRIG 167.0 (H) 12/23/2023   CHOLHDL 2 12/23/2023   Lab Results  Component Value Date   ALT 20 12/23/2023   AST 17 12/23/2023   ALKPHOS 110 12/23/2023   BILITOT 0.3 12/23/2023    Prediabetes: Diet/exercise approach previously.  Did not have a weight for her visit earlier this year. Refused weight last visit and today.  Plans on improved diet.  Exercise - recently restarted.  FH of Diabetes - father.  Lab Results  Component Value Date   HGBA1C 6.0 12/23/2023   Wt Readings from Last 3 Encounters:  01/03/23 202 lb 9.6 oz (91.9 kg)  09/05/22 195 lb (88.5 kg)  05/03/22 200 lb 9.6 oz (91 kg)    HM: Pneumonia vaccine - deferred.  PAP - has GYN - will call for appt. Mammogram - at GYN - plans to schedule appt  Flu vaccine today.     History Patient Active Problem  List   Diagnosis Date Noted   Hernia of anterior abdominal wall 06/03/2024   Change in bowel habit 02/06/2023   Colon cancer screening 02/06/2023   Flatulence, eructation and gas pain 02/06/2023   Left lower quadrant pain 02/06/2023   History of colonic polyps 05/10/2020   Attention deficit hyperactivity disorder (ADHD), combined type 11/06/2019   Mixed anxiety and depressive disorder 07/21/2019   Diverticulosis of colon 10/07/2017   Past Medical History:  Diagnosis Date   Attention and concentration deficit 07/21/2019   Contact lens/glasses fitting    Depression    Forgetfulness 07/21/2019   Gallstones    Mixed anxiety and depressive disorder 07/21/2019   No pertinent past medical history    Past Surgical History:  Procedure Laterality Date   CHOLECYSTECTOMY  05/08/2012   Procedure: LAPAROSCOPIC CHOLECYSTECTOMY WITH INTRAOPERATIVE CHOLANGIOGRAM;  Surgeon: Lynwood MALVA Pina, MD;  Location: [facility];  Service: General;  Laterality: N/A;   ENDOMETRIAL ABLATION  2000   Allergies  Allergen Reactions   Penicillins Nausea Only and Rash    Rash will spread all over the body.   Prior to Admission medications   Medication Sig Start Date End Date Taking? Authorizing Provider  amphetamine -dextroamphetamine  (ADDERALL) 20 MG tablet Take 1 tablet (20 mg total) by mouth 2 (two) times daily. 12/12/23 06/03/24 Yes Levora Reyes SAUNDERS, MD  amphetamine -dextroamphetamine  (ADDERALL) 20 MG tablet Take 1 tablet (20 mg total) by mouth 2 (two) times daily. 01/14/24  Yes Levora Reyes SAUNDERS, MD  amphetamine -dextroamphetamine  (ADDERALL) 20 MG tablet Take 1 tablet (20 mg total) by mouth 2 (two) times daily. 05/19/24  Yes Levora Reyes SAUNDERS, MD  escitalopram  (LEXAPRO ) 20 MG tablet Take 1 tablet (20 mg total) by mouth daily. 05/19/24  Yes Levora Reyes SAUNDERS, MD   Social History   Socioeconomic History   Marital status: Single    Spouse name: Not on file   Number of children: 0   Years of education: Not on file   Highest education level: Not on file  Occupational History   Not on file  Tobacco Use   Smoking status: Never    Passive exposure: Yes   Smokeless tobacco: Never  Vaping Use   Vaping status: Never Used  Substance and Sexual Activity   Alcohol use: Yes    Alcohol/week: 3.0 standard drinks of alcohol    Types: 2 Glasses of wine, 1 Shots of liquor per week    Comment: occ   Drug use: No   Sexual activity: Yes  Other Topics Concern   Not on file  Social History Narrative   Not on file   Social Drivers of Health   Financial Resource Strain: Low Risk  (07/15/2018)   Overall Financial Resource Strain (CARDIA)    Difficulty of Paying Living Expenses: Not hard at all  Food Insecurity: No Food Insecurity (07/15/2018)   Hunger Vital Sign    Worried About Running Out of Food in the Last Year: Never true    Ran Out of Food in the Last Year: Never true   Transportation Needs: No Transportation Needs (07/15/2018)   PRAPARE - Administrator, Civil Service (Medical): No    Lack of Transportation (Non-Medical): No  Physical Activity: Sufficiently Active (07/15/2018)   Exercise Vital Sign    Days of Exercise per Week: 5 days    Minutes of Exercise per Session: 60 min  Stress: Stress Concern Present (07/15/2018)   [employer] of Occupational Health - Occupational  Stress Questionnaire    Feeling of Stress : To some extent  Social Connections: Moderately Integrated (07/15/2018)   Social Connection and Isolation Panel    Frequency of Communication with Friends and Family: More than three times a week    Frequency of Social Gatherings with Friends and Family: More than three times a week    Attends Religious Services: More than 4 times per year    Active Member of [employer] or Organizations: Yes    Attends Banker Meetings: More than 4 times per year    Marital Status: Never married  Intimate Partner Violence: Unknown (07/15/2018)   Humiliation, Afraid, Rape, and Kick questionnaire    Fear of Current or Ex-Partner: Patient declined    Emotionally Abused: Patient declined    Physically Abused: Patient declined    Sexually Abused: Patient declined    Review of Systems Per HPI.   Objective:   Vitals:   06/03/24 1505  BP: 122/82  Pulse: 83  Resp: 16  Temp: 98 F (36.7 C)  TempSrc: Temporal  SpO2: 99%  Height: 6' (1.829 m)     Physical Exam Vitals reviewed.  Constitutional:      Appearance: Normal appearance. She is well-developed.  HENT:     Head: Normocephalic and atraumatic.  Eyes:     Conjunctiva/sclera: Conjunctivae normal.     Pupils: Pupils are equal, round, and reactive to light.  Neck:     Vascular: No carotid bruit.  Cardiovascular:     Rate and Rhythm: Normal rate and regular rhythm.     Heart sounds: Normal heart sounds.  Pulmonary:     Effort: Pulmonary effort is normal.      Breath sounds: Normal breath sounds.  Abdominal:     Palpations: Abdomen is soft. There is no pulsatile mass.     Tenderness: There is no abdominal tenderness.  Musculoskeletal:     Right lower leg: No edema.     Left lower leg: No edema.  Skin:    General: Skin is warm and dry.  Neurological:     Mental Status: She is alert and oriented to person, place, and time.  Psychiatric:        Mood and Affect: Mood normal.        Behavior: Behavior normal.        Assessment & Plan:  Samantha Parker is a 53 y.o. female . Attention deficit hyperactivity disorder (ADHD), combined type  - Stable control with current med regimen, okay to refill for 23-month intervals when due.  48-month follow-up in office.  Need for influenza vaccination - Plan: Flu vaccine trivalent PF, 6mos and older(Flulaval,Afluria,Fluarix,Fluzone)  Adjustment disorder with mixed anxiety and depressed mood  - Improved, stable with Lexapro  current dose, continue same.  Prediabetes - Plan: Comprehensive metabolic panel with GFR, Lipid panel, Hemoglobin A1c Hypertriglyceridemia - Plan: Lipid panel  - Check updated labs but commended on her recent increase exercise, healthier lifestyle approaches.  Will keep this in mind with labs from today if borderline as anticipate improvement with these changes.  60-month follow-up for physical.  Health maintenance discussed including following up with OB/GYN for Pap testing, mammogram.  No orders of the defined types were placed in this encounter.  Patient Instructions  Please call your gynecologist for discussion of pap testing, mammogram.   Thank you for coming in today. No change in medications at this time. If there are any concerns on your bloodwork, I will let you know. Take  care!     Signed,   Reyes Pines, MD [facility], [facility] 06/03/24 3:43 PM

## 2024-06-04 ENCOUNTER — Encounter: Payer: Self-pay | Admitting: Obstetrics and Gynecology

## 2024-06-04 LAB — COMPREHENSIVE METABOLIC PANEL WITH GFR
ALT: 22 U/L (ref 0–35)
AST: 17 U/L (ref 0–37)
Albumin: 4 g/dL (ref 3.5–5.2)
Alkaline Phosphatase: 93 U/L (ref 39–117)
BUN: 10 mg/dL (ref 6–23)
CO2: 28 meq/L (ref 19–32)
Calcium: 9.2 mg/dL (ref 8.4–10.5)
Chloride: 105 meq/L (ref 96–112)
Creatinine, Ser: 0.65 mg/dL (ref 0.40–1.20)
GFR: 99.39 mL/min (ref >60.00)
Glucose, Bld: 85 mg/dL (ref 70–99)
Potassium: 4 meq/L (ref 3.5–5.1)
Sodium: 141 meq/L (ref 135–145)
Total Bilirubin: 0.2 mg/dL (ref 0.2–1.2)
Total Protein: 7 g/dL (ref 6.0–8.3)

## 2024-06-04 LAB — LIPID PANEL
Cholesterol: 141 mg/dL (ref 0–200)
HDL: 64.3 mg/dL (ref >39.00)
LDL Cholesterol: 42 mg/dL (ref 0–99)
NonHDL: 76.69
Total CHOL/HDL Ratio: 2
Triglycerides: 174 mg/dL — ABNORMAL HIGH (ref 0.0–149.0)
VLDL: 34.8 mg/dL (ref 0.0–40.0)

## 2024-06-04 LAB — HEMOGLOBIN A1C: Hgb A1c MFr Bld: 6.4 % (ref 4.6–6.5)

## 2024-06-09 ENCOUNTER — Ambulatory Visit: Payer: Self-pay | Admitting: Family Medicine

## 2024-06-09 DIAGNOSIS — R7303 Prediabetes: Secondary | ICD-10-CM

## 2024-06-09 NOTE — Progress Notes (Signed)
 Please clarify if pt needs 3-6 month office visit or if this needs to be lab only visit to check A1c.  Pt has been notified

## 2024-06-10 NOTE — Telephone Encounter (Signed)
 Called patient and scheduled 3 month lab visit  Ordered A1C as future lab

## 2024-06-10 NOTE — Addendum Note (Signed)
 Addended by: Christ Fullenwider A on: 06/10/2024 04:40 PM   Modules accepted: Orders

## 2024-06-19 ENCOUNTER — Other Ambulatory Visit: Payer: Self-pay | Admitting: Family Medicine

## 2024-06-19 DIAGNOSIS — F902 Attention-deficit hyperactivity disorder, combined type: Secondary | ICD-10-CM

## 2024-06-19 MED ORDER — AMPHETAMINE-DEXTROAMPHETAMINE 20 MG PO TABS: 20.0000 mg | ORAL_TABLET | Freq: Two times a day (BID) | ORAL | 0 refills | Status: DC

## 2024-06-19 NOTE — Telephone Encounter (Signed)
 Adderall prescription request noted.  Controlled substance database reviewed.  #60 last filled on 05/19/2024.  No concerns previously.  Refill ordered.  Recently discussed medication.

## 2024-06-19 NOTE — Telephone Encounter (Signed)
 Medication was sent in by PCP. Called patient to let her know. She is aware.

## 2024-06-19 NOTE — Telephone Encounter (Signed)
 Copied from CRM #[id number]. Topic: Clinical - Medication Refill >> Jun 19, 2024 10:46 AM Samantha Parker wrote: Medication: amphetamine -dextroamphetamine  (ADDERALL) 20 MG tablet Pt said it was supposed to be called in after her 9/13 appt.  Has the patient contacted their pharmacy? Yes, did not have it.   This is the patient's preferred pharmacy:  [id number] [place], [place] - [address] AT [address] OF [facility] [address] [place]  [address] Phone: [phone] Fax: [phone]  Is this the correct pharmacy for this prescription? Yes  Has the prescription been filled recently? No  Is the patient out of the medication? Yes  Has the patient been seen for an appointment in the last year OR does the patient have an upcoming appointment? Yes  Can we respond through MyChart? No  Agent: Please be advised that Rx refills may take up to 3 business days. We ask that you follow-up with your pharmacy.

## 2024-06-19 NOTE — Telephone Encounter (Unsigned)
 Copied from CRM #[id number]. Topic: Clinical - Medication Refill >> Jun 19, 2024 10:46 AM Dedra B wrote: Medication: amphetamine -dextroamphetamine  (ADDERALL) 20 MG tablet Pt said it was supposed to be called in after her 9/13 appt.  Has the patient contacted their pharmacy? Yes, did not have it.   This is the patient's preferred pharmacy:  [id number] - [place], [place] - [address] AT [address] OF [facility] [address] [place] [address] Phone: [phone] Fax: [phone]  Is this the correct pharmacy for this prescription? Yes  Has the prescription been filled recently? No  Is the patient out of the medication? Yes  Has the patient been seen for an appointment in the last year OR does the patient have an upcoming appointment? Yes  Can we respond through MyChart? No  Agent: Please be advised that Rx refills may take up to 3 business days. We ask that you follow-up with your pharmacy. >> Jun 19, 2024  2:58 PM [place] J wrote: Patient said that Dr. Levora has seen her on the 10th of September and told her that he would send her medication in for a refill. The patient still has not received the medication and is needing this approved before the weekend.

## 2024-06-19 NOTE — Telephone Encounter (Signed)
 Requested Prescriptions   Pending Prescriptions Disp Refills   amphetamine -dextroamphetamine  (ADDERALL) 20 MG tablet 60 tablet 0    Sig: Take 1 tablet (20 mg total) by mouth 2 (two) times daily.     Date of patient request: 06/19/24 Last office visit: 06/03/2024 Upcoming visit: Visit date not found Last refill amount: 60 tablets

## 2024-08-11 ENCOUNTER — Ambulatory Visit: Payer: Self-pay

## 2024-08-11 NOTE — Telephone Encounter (Signed)
 FYI Only or Action Required?: FYI only for provider: home care advice provided.  Patient was last seen in primary care on 06/03/2024 by Levora Reyes SAUNDERS, MD.  Called Nurse Triage reporting Influenza.  Symptoms began several days ago.  Interventions attempted: OTC medications: Theraflu.  Symptoms are: gradually improving.  Triage Disposition: Home Care  Patient/caregiver understands and will follow disposition?: Yes    Copied from CRM #[id number]. Topic: Clinical - Red Word Triage >> Aug 11, 2024 10:00 AM Samantha Parker wrote: [facility] that prompted transfer to Nurse Triage: Patient states she feels she has the flu, fever, deep painful productive cough, extreme pain in chest. States she feels worse then when she had Covid. Reason for Disposition  [1] Probable mild influenza (no fever) or a common cold, with no complications AND [2] NOT HIGH RISK  Mild central chest pain that only occurs when coughing is also present  Answer Assessment - Initial Assessment Questions Additional info: Patient asking if she has refill of Adderall available at pharmacy, chart reviewed and confirmed she has one refill remaining.  Patient asking to confirm her appointment in December, reviewed book it and noted reschedule needed next to 09/10/24 lab appointment, this writer called to [facility] and spoke with Jackolyn who advised appointment does not need reschedule ok for patient to come in for 09/10/24 lab visit as scheduled.     1. SYMPTOMS: What is your main symptom or concern? (e.g., cough, fever, shortness of breath, muscle aches)     Cough, fever  2. ONSET: When did the symptoms start?      Saturday  3. COUGH: Do you have a cough? If Yes, ask: How bad is the cough?       Strong-yellow and white  4. FEVER: Do you have a fever? If Yes, ask: What is your temperature, how was it measured, and when did it start?     Feels feverish  5. BREATHING DIFFICULTY: Are you having any difficulty  breathing? (e.g., normal; shortness of breath, wheezing, unable to speak)      denies 6. BETTER-SAME-WORSE: Are you getting better, staying the same or getting worse compared to yesterday?  If getting worse, ask, In what way?     Worse  7. OTHER SYMPTOMS: Do you have any other symptoms?  (e.g., chills, fatigue, headache, loss of smell or taste, muscle pain, sore throat)     Chest pain during cough, body aches 8. INFLUENZA EXPOSURE: Was there any known exposure to influenza (flu) before the symptoms began?       9. INFLUENZA SUSPECTED: Why do you think you have influenza? (e.g., positive flu self-test at home, symptoms after exposure).     Took home test flu A positive  10. INFLUENZA VACCINE: Have you had the flu vaccine? If Yes, ask: When did you last get it?       yes 11. HIGH RISK FOR COMPLICATIONS: Do you have any chronic medical problems? (e.g., asthma, heart or lung disease, obesity, weak immune system)        12. PREGNANCY: Is there any chance you are pregnant? When was your last menstrual period?        13. O2 SATURATION MONITOR:  Do you use an oxygen saturation monitor (pulse oximeter) at home? If Yes, ask What is your reading (oxygen level) today? What is your usual oxygen saturation reading? (e.g., 95%)  Protocols used: Influenza (Flu) Suspected-A-AH

## 2024-08-11 NOTE — Telephone Encounter (Signed)
 Called patient to schedule appt. She stated she took the 3 in 1 test and is positive for flu A. She stated she do not need an appt. She has resting and is feeling a lot better

## 2024-08-11 NOTE — Telephone Encounter (Signed)
 Can we make this patient an appointment for influenza, virtual is okay if requested but she may be requested to get flu test from the pharmacy.

## 2024-09-10 ENCOUNTER — Other Ambulatory Visit: Payer: Self-pay

## 2024-09-14 ENCOUNTER — Telehealth: Payer: Self-pay | Admitting: Family Medicine

## 2024-09-14 DIAGNOSIS — F902 Attention-deficit hyperactivity disorder, combined type: Secondary | ICD-10-CM

## 2024-09-14 NOTE — Telephone Encounter (Unsigned)
 Copied from CRM #[id number]. Topic: Clinical - Medication Refill >> Sep 14, 2024  2:39 PM Nessti S wrote: Medication: amphetamine -dextroamphetamine  (ADDERALL) 20 MG tablet   Has the patient contacted their pharmacy? Yes (Agent: If no, request that the patient contact the pharmacy for the refill. If patient does not wish to contact the pharmacy document the reason why and proceed with request.) (Agent: If yes, when and what did the pharmacy advise?)  This is the patient's preferred pharmacy:  [id number] - [place], [place] - [address] AT [address] OF [address] [place] [address] Phone: [phone] Fax: [phone]  Is this the correct pharmacy for this prescription? Yes If no, delete pharmacy and type the correct one.   Has the prescription been filled recently? Yes  Is the patient out of the medication? Yes. Would like refill by 12/24 because she is going out of town 12/25  Has the patient been seen for an appointment in the last year OR does the patient have an upcoming appointment? Yes  Can we respond through MyChart? No  Agent: Please be advised that Rx refills may take up to 3 business days. We ask that you follow-up with your pharmacy.

## 2024-09-15 ENCOUNTER — Other Ambulatory Visit: Payer: Self-pay

## 2024-09-15 MED ORDER — AMPHETAMINE-DEXTROAMPHETAMINE 20 MG PO TABS: 20.0000 mg | ORAL_TABLET | Freq: Two times a day (BID) | ORAL | 0 refills | Status: DC

## 2024-09-15 NOTE — Telephone Encounter (Signed)
 Copied from CRM #[id number]. Topic: Clinical - Prescription Issue >> Sep 15, 2024  1:27 PM Samantha Parker wrote: Reason for CRM: [facility] -dextroamphetamine  (ADDERALL) 20 MG tablet [[id number]] that was sent in today due to stating it is to early. Patient is leaving out of town tonight and would like to know if we can call [facility] and give the okay for an early fill.

## 2024-09-15 NOTE — Telephone Encounter (Signed)
 Requested Prescriptions   Pending Prescriptions Disp Refills   amphetamine -dextroamphetamine  (ADDERALL) 20 MG tablet 60 tablet 0    Sig: Take 1 tablet (20 mg total) by mouth 2 (two) times daily.     Date of patient request: 09/15/2024 Last office visit: 06/03/2024 Upcoming visit: 01/06/2025 Date of last refill: 06/19/2024 Last refill amount: 60 tabs 0 refills

## 2024-09-15 NOTE — Telephone Encounter (Signed)
 Requested Prescriptions   Pending Prescriptions Disp Refills   amphetamine -dextroamphetamine  (ADDERALL) 20 MG tablet 60 tablet 0    Sig: Take 1 tablet (20 mg total) by mouth 2 (two) times daily.     Date of patient request: 09/15/24 Last office visit: 06/03/2024 Upcoming visit: 01/06/2025 Date of last refill: 12/12/23 Last refill amount: 60

## 2024-09-15 NOTE — Telephone Encounter (Signed)
 Copied from CRM #[id number]. Topic: Clinical - Medication Refill >> Sep 15, 2024 11:15 AM Samantha Parker wrote: Patient is completely out of medication. She is needing it filled as soon as possible; going out of town on Thursday.

## 2024-09-15 NOTE — Telephone Encounter (Signed)
 Is it okay to relay to pharmacy patient can pick this up early?

## 2024-09-21 ENCOUNTER — Other Ambulatory Visit: Payer: Self-pay

## 2024-10-19 ENCOUNTER — Other Ambulatory Visit: Payer: Self-pay | Admitting: Family Medicine

## 2024-10-19 DIAGNOSIS — F902 Attention-deficit hyperactivity disorder, combined type: Secondary | ICD-10-CM

## 2024-10-19 MED ORDER — AMPHETAMINE-DEXTROAMPHETAMINE 20 MG PO TABS: 20.0000 mg | ORAL_TABLET | Freq: Two times a day (BID) | ORAL | 0 refills | Status: AC

## 2024-10-19 NOTE — Telephone Encounter (Signed)
 Medication discussed at September 10 visit.  Stable at that time with 63-month follow-up planned.  Controlled substance database reviewed.  Prescription last filled on 09/16/2024.  3 months refill ordered.

## 2024-10-19 NOTE — Telephone Encounter (Signed)
 Copied from CRM [id number]. Topic: Clinical - Medication Refill >> Oct 19, 2024 10:19 AM Aleatha C wrote: Medication:  amphetamine -dextroamphetamine  (ADDERALL) 20 MG tablet   Has the patient contacted their pharmacy? No (Agent: If no, request that the patient contact the pharmacy for the refill. If patient does not wish to contact the pharmacy document the reason why and proceed with request.) (Agent: If yes, when and what did the pharmacy advise?)  This is the patient's preferred pharmacy:  [id number] [place] - [address] [facility] [address] [facility] [address] [place] [address] Phone: [phone] Fax: [phone]  Is this the correct pharmacy for this prescription? Yes If no, delete pharmacy and type the correct one.   Has the prescription been filled recently? No  Is the patient out of the medication? Yes  Has the patient been seen for an appointment in the last year OR does the patient have an upcoming appointment? Yes  Can we respond through MyChart? No  Agent: Please be advised that Rx refills may take up to 3 business days. We ask that you follow-up with your pharmacy.

## 2024-10-19 NOTE — Telephone Encounter (Signed)
 Requested Prescriptions   Pending Prescriptions Disp Refills   amphetamine -dextroamphetamine  (ADDERALL) 20 MG tablet 60 tablet 0    Sig: Take 1 tablet (20 mg total) by mouth 2 (two) times daily.     Date of patient request: 10/19/24 Last office visit: 06/03/2024 Upcoming visit: 01/06/2025 Date of last refill: 09/15/24 Last refill amount: 60

## 2025-01-06 ENCOUNTER — Encounter: Payer: Self-pay | Admitting: Family Medicine
